# Patient Record
Sex: Female | Born: 1980 | Race: White | Hispanic: No | Marital: Single | Smoking: Never smoker
Health system: Southern US, Community
[De-identification: ages and names within clinical notes are randomized; demographics above are authoritative.]

## PROBLEM LIST (undated history)

## (undated) ENCOUNTER — Inpatient Hospital Stay (HOSPITAL_COMMUNITY): Payer: Self-pay

## (undated) DIAGNOSIS — R519 Headache, unspecified: Secondary | ICD-10-CM

## (undated) DIAGNOSIS — Z8619 Personal history of other infectious and parasitic diseases: Secondary | ICD-10-CM

## (undated) DIAGNOSIS — E039 Hypothyroidism, unspecified: Secondary | ICD-10-CM

## (undated) DIAGNOSIS — R51 Headache: Secondary | ICD-10-CM

## (undated) DIAGNOSIS — Z87448 Personal history of other diseases of urinary system: Secondary | ICD-10-CM

## (undated) HISTORY — DX: Headache, unspecified: R51.9

## (undated) HISTORY — DX: Personal history of other infectious and parasitic diseases: Z86.19

## (undated) HISTORY — DX: Personal history of other diseases of urinary system: Z87.448

## (undated) HISTORY — DX: Headache: R51

---

## 2012-01-04 HISTORY — PX: SHOULDER SURGERY: SHX246

## 2014-08-11 LAB — OB RESULTS CONSOLE HEPATITIS B SURFACE ANTIGEN: HEP B S AG: NEGATIVE

## 2014-08-11 LAB — OB RESULTS CONSOLE ABO/RH: RH Type: POSITIVE

## 2014-08-11 LAB — OB RESULTS CONSOLE RPR: RPR: NONREACTIVE

## 2014-08-11 LAB — OB RESULTS CONSOLE HIV ANTIBODY (ROUTINE TESTING): HIV: NONREACTIVE

## 2014-08-11 LAB — OB RESULTS CONSOLE GC/CHLAMYDIA
CHLAMYDIA, DNA PROBE: NEGATIVE
GC PROBE AMP, GENITAL: NEGATIVE

## 2014-08-11 LAB — OB RESULTS CONSOLE ANTIBODY SCREEN: ANTIBODY SCREEN: NEGATIVE

## 2014-08-11 LAB — OB RESULTS CONSOLE RUBELLA ANTIBODY, IGM: RUBELLA: IMMUNE

## 2015-01-04 NOTE — L&D Delivery Note (Signed)
Delivery Note At 6:30 PM a viable female was delivered via Vaginal, Spontaneous Delivery (Presentation: Left Occiput Anterior).  APGAR: , ; weight  .   Placenta status: Intact, Spontaneous.  Cord: 3 vessels with the following complications: None.  Cord pH: not sent  Anesthesia: Local  Episiotomy: None Lacerations: 2nd degree Suture Repair: 3.0 vicryl rapide Est. Blood Loss (mL): 100  Mom to postpartum.  Baby to Couplet care / Skin to Skin.  Meriel PicaHOLLAND,[name], 6:47 PM

## 2015-03-20 ENCOUNTER — Encounter (HOSPITAL_COMMUNITY): Payer: Self-pay | Admitting: *Deleted

## 2015-03-20 ENCOUNTER — Telehealth (HOSPITAL_COMMUNITY): Payer: Self-pay | Admitting: *Deleted

## 2015-03-20 LAB — OB RESULTS CONSOLE GBS: GBS: POSITIVE

## 2015-03-20 NOTE — Telephone Encounter (Signed)
Preadmission screen  

## 2015-03-21 ENCOUNTER — Inpatient Hospital Stay (HOSPITAL_COMMUNITY)
Admission: AD | Admit: 2015-03-21 | Discharge: 2015-03-23 | DRG: 775 | Disposition: A | Discharge status: Home / Self Care | Payer: BLUE CROSS/BLUE SHIELD | Source: Ambulatory Visit | Attending: Obstetrics and Gynecology | Admitting: Obstetrics and Gynecology

## 2015-03-21 ENCOUNTER — Encounter (HOSPITAL_COMMUNITY): Payer: Self-pay | Admitting: *Deleted

## 2015-03-21 DIAGNOSIS — Z3A40 40 weeks gestation of pregnancy: Secondary | ICD-10-CM | POA: Diagnosis not present

## 2015-03-21 LAB — CBC
HCT: 38.7 % (ref 36.0–46.0)
Hemoglobin: 13.5 g/dL (ref 12.0–15.0)
MCH: 29.1 pg (ref 26.0–34.0)
MCHC: 34.9 g/dL (ref 30.0–36.0)
MCV: 83.4 fL (ref 78.0–100.0)
PLATELETS: 255 10*3/uL (ref 150–400)
RBC: 4.64 MIL/uL (ref 3.87–5.11)
RDW: 13.5 % (ref 11.5–15.5)
WBC: 12 10*3/uL — ABNORMAL HIGH (ref 4.0–10.5)

## 2015-03-21 LAB — TYPE AND SCREEN
ABO/RH(D): A POS
Antibody Screen: NEGATIVE

## 2015-03-21 MED ORDER — IBUPROFEN 800 MG PO TABS
800.0000 mg | ORAL_TABLET | Freq: Three times a day (TID) | ORAL | Status: DC | PRN
  Filled 2015-03-21 (×2): qty 1

## 2015-03-21 MED ORDER — OXYTOCIN 10 UNIT/ML IJ SOLN
2.5000 [IU]/h | INTRAVENOUS | Status: DC
  Filled 2015-03-21: qty 10

## 2015-03-21 MED ORDER — ONDANSETRON HCL 4 MG/2ML IJ SOLN: 4.0000 mg | Freq: Four times a day (QID) | INTRAMUSCULAR | Status: DC | PRN

## 2015-03-21 MED ORDER — LACTATED RINGERS IV SOLN
INTRAVENOUS | Status: DC
  Administered 2015-03-21: 17:00:00 via INTRAVENOUS

## 2015-03-21 MED ORDER — PHENYLEPHRINE 40 MCG/ML (10ML) SYRINGE FOR IV PUSH (FOR BLOOD PRESSURE SUPPORT)
80.0000 ug | PREFILLED_SYRINGE | INTRAVENOUS | Status: DC | PRN
  Filled 2015-03-21: qty 2

## 2015-03-21 MED ORDER — DIPHENHYDRAMINE HCL 50 MG/ML IJ SOLN: 12.5000 mg | INTRAMUSCULAR | Status: DC | PRN

## 2015-03-21 MED ORDER — BISACODYL 10 MG RE SUPP: 10.0000 mg | Freq: Every day | RECTAL | Status: DC | PRN

## 2015-03-21 MED ORDER — OXYTOCIN BOLUS FROM INFUSION
500.0000 mL | INTRAVENOUS | Status: DC
Start: 2015-03-21 — End: 2015-03-21

## 2015-03-21 MED ORDER — FLEET ENEMA 7-19 GM/118ML RE ENEM: 1.0000 | ENEMA | Freq: Every day | RECTAL | Status: DC | PRN

## 2015-03-21 MED ORDER — CITRIC ACID-SODIUM CITRATE 334-500 MG/5ML PO SOLN: 30.0000 mL | ORAL | Status: DC | PRN

## 2015-03-21 MED ORDER — PRENATAL MULTIVITAMIN CH
1.0000 | ORAL_TABLET | Freq: Every day | ORAL | Status: DC
  Administered 2015-03-22: 1 via ORAL
  Filled 2015-03-21 (×2): qty 1

## 2015-03-21 MED ORDER — ONDANSETRON HCL 4 MG/2ML IJ SOLN: 4.0000 mg | INTRAMUSCULAR | Status: DC | PRN

## 2015-03-21 MED ORDER — SENNOSIDES-DOCUSATE SODIUM 8.6-50 MG PO TABS
2.0000 | ORAL_TABLET | ORAL | Status: DC
  Administered 2015-03-21: 2 via ORAL
  Filled 2015-03-21 (×2): qty 2

## 2015-03-21 MED ORDER — OXYTOCIN 10 UNIT/ML IJ SOLN
10.0000 [IU] | Freq: Once | INTRAMUSCULAR | Status: AC
  Administered 2015-03-21: 10 [IU] via INTRAMUSCULAR

## 2015-03-21 MED ORDER — MEASLES, MUMPS & RUBELLA VAC ~~LOC~~ INJ
0.5000 mL | INJECTION | Freq: Once | SUBCUTANEOUS | Status: DC
  Filled 2015-03-21: qty 0.5

## 2015-03-21 MED ORDER — LIDOCAINE HCL (PF) 1 % IJ SOLN
30.0000 mL | INTRAMUSCULAR | Status: AC | PRN
  Administered 2015-03-21: 30 mL via SUBCUTANEOUS
  Filled 2015-03-21: qty 30

## 2015-03-21 MED ORDER — ONDANSETRON HCL 4 MG PO TABS: 4.0000 mg | ORAL_TABLET | ORAL | Status: DC | PRN

## 2015-03-21 MED ORDER — DIBUCAINE 1 % RE OINT
1.0000 "application " | TOPICAL_OINTMENT | RECTAL | Status: DC | PRN
  Administered 2015-03-21: 1 via RECTAL
  Filled 2015-03-21: qty 28

## 2015-03-21 MED ORDER — LANOLIN HYDROUS EX OINT: TOPICAL_OINTMENT | CUTANEOUS | Status: DC | PRN

## 2015-03-21 MED ORDER — ACETAMINOPHEN 325 MG PO TABS: 650.0000 mg | ORAL_TABLET | ORAL | Status: DC | PRN

## 2015-03-21 MED ORDER — OXYCODONE-ACETAMINOPHEN 5-325 MG PO TABS: 1.0000 | ORAL_TABLET | ORAL | Status: DC | PRN

## 2015-03-21 MED ORDER — SIMETHICONE 80 MG PO CHEW: 80.0000 mg | CHEWABLE_TABLET | ORAL | Status: DC | PRN

## 2015-03-21 MED ORDER — OXYCODONE-ACETAMINOPHEN 5-325 MG PO TABS: 2.0000 | ORAL_TABLET | ORAL | Status: DC | PRN

## 2015-03-21 MED ORDER — OXYTOCIN 10 UNIT/ML IJ SOLN
INTRAMUSCULAR | Status: AC
  Filled 2015-03-21: qty 1

## 2015-03-21 MED ORDER — LACTATED RINGERS IV SOLN: 500.0000 mL | Freq: Once | INTRAVENOUS | Status: DC

## 2015-03-21 MED ORDER — FENTANYL 2.5 MCG/ML BUPIVACAINE 1/10 % EPIDURAL INFUSION (WH - ANES): 14.0000 mL/h | INTRAMUSCULAR | Status: DC | PRN

## 2015-03-21 MED ORDER — ZOLPIDEM TARTRATE 5 MG PO TABS: 5.0000 mg | ORAL_TABLET | Freq: Every evening | ORAL | Status: DC | PRN

## 2015-03-21 MED ORDER — LACTATED RINGERS IV SOLN
500.0000 mL | INTRAVENOUS | Status: DC | PRN
  Administered 2015-03-21: 1000 mL via INTRAVENOUS

## 2015-03-21 MED ORDER — WITCH HAZEL-GLYCERIN EX PADS
1.0000 "application " | MEDICATED_PAD | CUTANEOUS | Status: DC | PRN
  Administered 2015-03-21: 1 via TOPICAL

## 2015-03-21 MED ORDER — TETANUS-DIPHTH-ACELL PERTUSSIS 5-2.5-18.5 LF-MCG/0.5 IM SUSP: 0.5000 mL | Freq: Once | INTRAMUSCULAR | Status: DC

## 2015-03-21 MED ORDER — EPHEDRINE 5 MG/ML INJ
10.0000 mg | INTRAVENOUS | Status: DC | PRN
  Filled 2015-03-21: qty 2

## 2015-03-21 MED ORDER — DIPHENHYDRAMINE HCL 25 MG PO CAPS: 25.0000 mg | ORAL_CAPSULE | Freq: Four times a day (QID) | ORAL | Status: DC | PRN

## 2015-03-21 MED ORDER — SODIUM CHLORIDE 0.9 % IV SOLN
2.0000 g | Freq: Once | INTRAVENOUS | Status: AC
  Administered 2015-03-21: 2 g via INTRAVENOUS
  Filled 2015-03-21: qty 2000

## 2015-03-21 MED ORDER — BENZOCAINE-MENTHOL 20-0.5 % EX AERO
1.0000 "application " | INHALATION_SPRAY | CUTANEOUS | Status: DC | PRN
  Administered 2015-03-21: 1 via TOPICAL
  Filled 2015-03-21: qty 56

## 2015-03-21 MED ORDER — FLEET ENEMA 7-19 GM/118ML RE ENEM: 1.0000 | ENEMA | RECTAL | Status: DC | PRN

## 2015-03-21 NOTE — H&P (Signed)
[name] is a 35 y.o. female presenting for SROM + labor. Maternal Medical History:  Reason for admission: Rupture of membranes and contractions.   Contractions: Onset was 3-5 hours ago.   Frequency: regular.   Perceived severity is moderate.    Fetal activity: Perceived fetal activity is normal.      OB History    Gravida Para Term Preterm AB TAB SAB Ectopic Multiple Living   2    1 1  0        Past Medical History  Diagnosis Date  . Hx of varicella   . Headache   . Hx of pyelonephritis     x1 in high school   Past Surgical History  Procedure Laterality Date  . Shoulder surgery  2014    R shoulder   Family History: family history includes Asthma in her paternal grandmother; Cancer in her maternal aunt, paternal aunt, and paternal grandmother; Diabetes in her maternal grandmother and paternal grandmother; Heart attack in her brother and mother; Heart disease in her maternal grandmother; Thyroid disease in her maternal aunt, mother, paternal aunt, and paternal uncle. Social History:  reports that she has never smoked. She has never used smokeless tobacco. She reports that she does not drink alcohol or use illicit drugs.   Prenatal Transfer Tool  Maternal Diabetes: No Genetic Screening: Normal Maternal Ultrasounds/Referrals: Normal Fetal Ultrasounds or other Referrals:  None Maternal Substance Abuse:  No Significant Maternal Medications:  None Significant Maternal Lab Results:  None Other Comments:  None  ROS  Dilation: 10 Effacement (%): 100 Station: +1 Exam by:: Arelia SneddonS. FAulk, RN Blood pressure 147/93, pulse 90, temperature 98.5 F (36.9 C), temperature source Oral, resp. rate 18, last menstrual period 06/13/2014. Exam Physical Exam  Constitutional: She is oriented to person, place, and time. She appears well-developed and well-nourished.  HENT:  Head: Normocephalic and atraumatic.  Neck: Normal range of motion. Neck supple.  Cardiovascular: Normal rate and  regular rhythm.   Respiratory: Effort normal and breath sounds normal.  GI:  Term FH/ FHR 148  Genitourinary:  Adm 3>>>now C/C/+1  Musculoskeletal: Normal range of motion.  Neurological: She is alert and oriented to person, place, and time.    Prenatal labs: ABO, Rh: A/Positive/-- (08/08 0000) Antibody: Negative (08/08 0000) Rubella: Immune (08/08 0000) RPR: Nonreactive (08/08 0000)  HBsAg: Negative (08/08 0000)  HIV: Non-reactive (08/08 0000)  GBS: Positive (03/17 0000)   Assessment/Plan: Term IUP, labor, + GBS>>>for IV PCN   Webb Weed M 03/21/2015, 5:41 PM

## 2015-03-21 NOTE — MAU Note (Signed)
Pt reports water broke about 30 min ago and clear fluid out. reprots ctx on and off since yesterday. Good fetal movement felt.

## 2015-03-22 ENCOUNTER — Encounter (HOSPITAL_COMMUNITY)
Admission: AD | Disposition: A | Discharge status: Home / Self Care | Payer: Self-pay | Source: Ambulatory Visit | Attending: Obstetrics and Gynecology

## 2015-03-22 LAB — ABO/RH: ABO/RH(D): A POS

## 2015-03-22 LAB — CBC
HEMATOCRIT: 34.6 % — AB (ref 36.0–46.0)
Hemoglobin: 12 g/dL (ref 12.0–15.0)
MCH: 28.9 pg (ref 26.0–34.0)
MCHC: 34.7 g/dL (ref 30.0–36.0)
MCV: 83.4 fL (ref 78.0–100.0)
Platelets: 247 10*3/uL (ref 150–400)
RBC: 4.15 MIL/uL (ref 3.87–5.11)
RDW: 13.8 % (ref 11.5–15.5)
WBC: 14.2 10*3/uL — ABNORMAL HIGH (ref 4.0–10.5)

## 2015-03-22 LAB — RPR: RPR: NONREACTIVE

## 2015-03-22 SURGERY — LIGATION, FALLOPIAN TUBE, POSTPARTUM
Anesthesia: Choice | Laterality: Bilateral

## 2015-03-22 MED ORDER — LACTATED RINGERS IV SOLN: INTRAVENOUS | Status: DC

## 2015-03-22 MED ORDER — FAMOTIDINE 20 MG PO TABS
40.0000 mg | ORAL_TABLET | Freq: Once | ORAL | Status: AC
  Administered 2015-03-22: 40 mg via ORAL
  Filled 2015-03-22: qty 2

## 2015-03-22 MED ORDER — METOCLOPRAMIDE HCL 10 MG PO TABS
10.0000 mg | ORAL_TABLET | Freq: Once | ORAL | Status: AC
  Administered 2015-03-22: 10 mg via ORAL
  Filled 2015-03-22: qty 1

## 2015-03-22 NOTE — Progress Notes (Signed)
Patient ID: [name], female   DOB: Sep 14, 1980, 35 y.o.   MRN: [id number]   Pt is NPO , req PPTL, pt + husband in agreement they do NOT want further pregnancies>>proced + risks reviewed, along with failure rate 2-3 /1000

## 2015-03-22 NOTE — Progress Notes (Signed)
PPTL cancelled at pt request

## 2015-03-22 NOTE — Lactation Note (Signed)
This note was copied from a baby's chart. Lactation Consultation Note Initial visit at 23 hours of age.  Mom reports a few good feedings and then has been sleepy. [name]  Encouraged to feed with early cues on demand. [name]   Early newborn behavior discussed.  Hand expression reported by mom with colostrum visible.  Mom to call for assist as needed.    Patient Name: Stephanie Rella Larveicole Guerrero JYNWG'NToday's Date: 03/22/2015 Reason for consult: Initial assessment   Maternal Data Has patient been taught Hand Expression?: Yes Does the patient have breastfeeding experience prior to this delivery?: No  Feeding Feeding Type: Breast Fed (instructed to call for Latch score at next feeding) Length of feed: 15 min  LATCH Score/Interventions                Intervention(s): Breastfeeding basics reviewed     Lactation Tools Discussed/Used     Consult Status Consult Status: Follow-up Date: 03/23/15 Follow-up type: In-patient    [name], [name] 03/22/2015, 5:42 PM

## 2015-03-22 NOTE — Progress Notes (Signed)
Spouse requested vital signs be done after wife awakens.

## 2015-03-22 NOTE — Progress Notes (Signed)
Patient declines Flu vaccine at this time.

## 2015-03-23 ENCOUNTER — Inpatient Hospital Stay (HOSPITAL_COMMUNITY): Admission: RE | Admit: 2015-03-23 | Payer: BLUE CROSS/BLUE SHIELD | Source: Ambulatory Visit

## 2015-03-23 LAB — COMPREHENSIVE METABOLIC PANEL
ALK PHOS: 109 U/L (ref 38–126)
ALT: 14 U/L (ref 14–54)
AST: 17 U/L (ref 15–41)
Albumin: 2.8 g/dL — ABNORMAL LOW (ref 3.5–5.0)
Anion gap: 6 (ref 5–15)
BILIRUBIN TOTAL: 0.2 mg/dL — AB (ref 0.3–1.2)
BUN: 10 mg/dL (ref 6–20)
CALCIUM: 8.4 mg/dL — AB (ref 8.9–10.3)
CO2: 27 mmol/L (ref 22–32)
CREATININE: 0.61 mg/dL (ref 0.44–1.00)
Chloride: 106 mmol/L (ref 101–111)
Glucose, Bld: 83 mg/dL (ref 65–99)
Potassium: 4.1 mmol/L (ref 3.5–5.1)
Sodium: 139 mmol/L (ref 135–145)
TOTAL PROTEIN: 5.9 g/dL — AB (ref 6.5–8.1)

## 2015-03-23 LAB — CBC
HEMATOCRIT: 35.2 % — AB (ref 36.0–46.0)
Hemoglobin: 12 g/dL (ref 12.0–15.0)
MCH: 28.8 pg (ref 26.0–34.0)
MCHC: 34.1 g/dL (ref 30.0–36.0)
MCV: 84.6 fL (ref 78.0–100.0)
Platelets: 238 10*3/uL (ref 150–400)
RBC: 4.16 MIL/uL (ref 3.87–5.11)
RDW: 13.8 % (ref 11.5–15.5)
WBC: 9 10*3/uL (ref 4.0–10.5)

## 2015-03-23 NOTE — Lactation Note (Signed)
This note was copied from a baby's chart. Lactation Consultation Note  Patient Name: Boy Rella Larveicole [place] OZHYQ'MToday's Date: 03/23/2015 Reason for consult: Follow-up assessment   Follow up with mom of 40 hour old infant. Infant with 8 BF for 10-25 minutes. 2 attempts, 1 void and 6 stools in last 24 hours. Infant weight 6 lb 3.5 oz with 6% weight loss since birth. LATCH Scores 8 by bedside RN.  Mom reports that infant sleepy since circumcision this am, enc her to place him STS and attempt to feed every few hours or as he cues. Infant was asleep in crib. Enc mom to call for feeding assistance as needed prior to d/c.  Reviewed all BF information in Taking Care of Baby and Me Booklet. Reviewed I/O and maintaining feeding log and taking to Ped visit. [facility]  Reviewed engorgement prevention/treatment/comfort pumping with mom. Mom has a Personal DEBP at bedside.   Reviewed LC Brochure, mom aware of OP Services, LC Phone # and BF Support Groups. Enc parents to call with questions/concerns prn.     Maternal Data Has patient been taught Hand Expression?: Yes Does the patient have breastfeeding experience prior to this delivery?: No  Feeding Feeding Type: Breast Fed Length of feed: 20 min  LATCH Score/Interventions Latch: Grasps breast easily, tongue down, lips flanged, rhythmical sucking.  Audible Swallowing: Spontaneous and intermittent  Type of Nipple: Everted at rest and after stimulation  Comfort (Breast/Nipple): Filling, red/small blisters or bruises, mild/mod discomfort  Problem noted: Mild/Moderate discomfort Interventions (Mild/moderate discomfort): Hand expression  Hold (Positioning): No assistance needed to correctly position infant at breast.  LATCH Score: 9  Lactation Tools Discussed/Used     Consult Status Consult Status: Complete Follow-up type: Call as needed    Ed BlalockSharon S Leyton Brownlee 03/23/2015, 11:19 AM

## 2015-03-23 NOTE — Progress Notes (Signed)
Post Partum Day 2 Subjective:  no complaints, denies HA, blurred vision or RUQ pain   Objective: Blood pressure 166/93, pulse 76, temperature 98 F (36.7 C), temperature source Oral, resp. rate 18, height 5\' 2"  (1.575 m), weight 210 lb (95.255 kg), last menstrual period 06/13/2014, SpO2 98 %, unknown if currently breastfeeding.  Physical Exam:  General: no complaints Lochia: appropriate Uterine Fundus: firm Incision: healing well DVT Evaluation: No evidence of DVT seen on physical exam. Negative Homan's sign. No cords or calf tenderness. No significant calf/ankle edema. DTR's 1+ no clonus   Recent Labs  03/21/15 1657 03/22/15 0530  HGB 13.5 12.0  HCT 38.7 34.6*    Assessment/Plan: Discharge home bp rechecked, 132/86, will check cmp and cbc prior to discharge  LOS: 2 days   Chung Chagoya G 03/23/2015, 8:18 AM

## 2015-03-23 NOTE — Progress Notes (Signed)
Dr Marcelle OverlieHolland called and given report on patients elevated B/P. 0 orders received at this time. Per Dr Marcelle OverlieHolland, patient will be assessed during rounds.

## 2015-03-23 NOTE — Discharge Summary (Signed)
Obstetric Discharge Summary Reason for Admission: rupture of membranes Prenatal Procedures: ultrasound Intrapartum Procedures: spontaneous vaginal delivery Postpartum Procedures: none Complications-Operative and Postpartum: 2 degree perineal laceration HEMOGLOBIN  Date Value Ref Range Status  03/22/2015 12.0 12.0 - 15.0 g/dL Final   HCT  Date Value Ref Range Status  03/22/2015 34.6* 36.0 - 46.0 % Final    Physical Exam:  General: alert and cooperative Lochia: appropriate Uterine Fundus: firm Incision: healing well DVT Evaluation: No evidence of DVT seen on physical exam. Negative Homan's sign. No cords or calf tenderness. No significant calf/ankle edema.  Discharge Diagnoses: Term Pregnancy-delivered  Discharge Information: Date: 03/23/2015 Activity: pelvic rest Diet: routine Medications: PNV and Ibuprofen Condition: stable Instructions: refer to practice specific booklet Discharge to: home   Newborn Data: Live born female  Birth Weight: 6 lb 9.3 oz (2985 g) APGAR: 6, 8  Home with mother and desires circ prior to discharge.  [name] G 03/23/2015, 8:23 AM

## 2015-03-24 ENCOUNTER — Telehealth (HOSPITAL_COMMUNITY): Payer: Self-pay | Admitting: Lactation Services

## 2015-03-24 NOTE — Telephone Encounter (Signed)
Joni Reiningicole called concerned how her "left breast was swollen up and big and warm".  She is putting ice on it, but when she pumps, milk is only coming out in one "pore".  Discussed that when milk volume increases, breasts will often become heavy, warm, and larger.  Suggested she feed baby first on left breast, and pump for comfort after breastfeeding.  If breasts are very full and hard, ice packs for 20 minutes with pumping after can be helpful to relieve the fullness.  Mom states baby is latching and feeding well.  To call us back as needed for help.

## 2015-10-09 ENCOUNTER — Ambulatory Visit
Admission: EM | Admit: 2015-10-09 | Discharge: 2015-10-09 | Disposition: A | Discharge status: Home / Self Care | Payer: BLUE CROSS/BLUE SHIELD | Attending: Family Medicine | Admitting: Family Medicine

## 2015-10-09 ENCOUNTER — Encounter: Payer: Self-pay | Admitting: *Deleted

## 2015-10-09 DIAGNOSIS — L089 Local infection of the skin and subcutaneous tissue, unspecified: Secondary | ICD-10-CM

## 2015-10-09 DIAGNOSIS — L723 Sebaceous cyst: Secondary | ICD-10-CM | POA: Diagnosis not present

## 2015-10-09 MED ORDER — LIDOCAINE HCL (PF) 1 % IJ SOLN: 5.0000 mL | Freq: Once | INTRAMUSCULAR | Status: DC

## 2015-10-09 MED ORDER — SULFAMETHOXAZOLE-TRIMETHOPRIM 800-160 MG PO TABS: 1.0000 | ORAL_TABLET | Freq: Two times a day (BID) | ORAL | 0 refills | Status: AC

## 2015-10-09 NOTE — ED Provider Notes (Signed)
[facility] ____________________________________________  Time seen: Approximately 9:49 AM  I have reviewed the triage vital signs and the nursing notes.   HISTORY  Chief Complaint Abscess   HPI [name] is a 35 y.o. female presents for evaluation of the tender, red and swollen cyst to her right back over the last 5 days. Patient reports that she has had a cyst that area for many years but reports the last 5 days the area has become red and swollen. States mild tenderness to that same area. Denies any drainage. Patient reports that she feels well otherwise.  Denies fevers, nausea, vomiting, diarrhea, midline back pain, pain radiation or other complaints. Patient reports that she does occasionally still breast-feeding her 573-month-old. Patient states that she does not get much breast milk out at this time and states if needed she has no problems to pump and dump at this time. Reports last menstrual period was 2 weeks ago, denies chance of pregnancy.   TOMBLIN Cristie HemII,JAMES E, MD PCP   Past Medical History:  Diagnosis Date  . Headache   . Hx of pyelonephritis    x1 in high school  . Hx of varicella     There are no active problems to display for this patient.   Past Surgical History:  Procedure Laterality Date  . SHOULDER SURGERY  2014   R shoulder    Current Outpatient Rx  . Order #: [id number] Class: Historical Med  . Order #: [id number] Class: Normal     Current Facility-Administered Medications:  .  lidocaine (PF) (XYLOCAINE) 1 % injection 5 mL, 5 mL, Other, Once, Renford DillsLindsey Ceili Boshers, NP  Current Outpatient Prescriptions:  .  Prenatal Vit-Fe Fumarate-FA (MULTIVITAMIN-PRENATAL) 27-0.8 MG TABS tablet, Take 1 tablet by mouth daily at 12 noon., Disp: , Rfl:  .  sulfamethoxazole-trimethoprim (BACTRIM DS,SEPTRA DS) 800-160 MG tablet, Take 1 tablet by mouth 2 (two) times daily., Disp: 14 tablet, Rfl: 0  Allergies Review of patient's allergies indicates no known  allergies.  Family History  Problem Relation Age of Onset  . Diabetes Paternal Grandmother   . Cancer Paternal Grandmother   . Asthma Paternal Grandmother   . Heart attack Mother   . Thyroid disease Mother   . Heart attack Brother   . Thyroid disease Maternal Aunt   . Cancer Maternal Aunt     breast  . Thyroid disease Paternal Aunt   . Cancer Paternal Aunt   . Thyroid disease Paternal Uncle   . Heart disease Maternal Grandmother   . Diabetes Maternal Grandmother     Social History Social History  Substance Use Topics  . Smoking status: Never Smoker  . Smokeless tobacco: Never Used  . Alcohol use No    Review of Systems Constitutional: No fever/chills Eyes: No visual changes. ENT: No sore throat. Cardiovascular: Denies chest pain. Respiratory: Denies shortness of breath. Gastrointestinal: No abdominal pain.  No nausea, no vomiting.  No diarrhea.  No constipation. Genitourinary: Negative for dysuria. Musculoskeletal: Negative for back pain. Skin: Negative for rash. As above. Neurological: Negative for headaches, focal weakness or numbness.  10-point ROS otherwise negative.  ____________________________________________   PHYSICAL EXAM:  VITAL SIGNS: ED Triage Vitals  Enc Vitals Group     BP 10/09/15 0936 116/79     Pulse Rate 10/09/15 0936 65     Resp 10/09/15 0936 16     Temp 10/09/15 0936 99.1 F (37.3 C)     Temp Source 10/09/15 0936 Oral     SpO2 10/09/15  0936 100 %     Weight 10/09/15 0937 188 lb (85.3 kg)     Height 10/09/15 0937 5\' 2"  (1.575 m)     Head Circumference --      Peak Flow --      Pain Score 10/09/15 0940 3     Pain Loc --      Pain Edu? --      Excl. in GC? --     Constitutional: Alert and oriented. Well appearing and in no acute distress. Eyes: Conjunctivae are normal. PERRL. EOMI. ENT      Head: Normocephalic and atraumatic.      Mouth/Throat: Mucous membranes are moist.Oropharynx non-erythematous. Cardiovascular: Normal rate,  regular rhythm. Grossly normal heart sounds.  Good peripheral circulation. Respiratory: Normal respiratory effort without tachypnea nor retractions. Breath sounds are clear and equal bilaterally. No wheezes/rales/rhonchi.. Gastrointestinal: Soft and nontender. No distention. No CVA tenderness. Musculoskeletal:  Ambulatory with steady gait.  Neurologic:  Normal speech and language. No gross focal neurologic deficits are appreciated. Speech is normal. No gait instability.  Skin:  Skin is warm, dry and intact. No rash noted. Except: Right lower lumbar back area of approximately 3 x 3 cm of moderate induration with fluctuance and mild to moderate immediate surrounding erythema, no other surrounding erythema, mild tenderness to direct palpation, slight pointing.  Psychiatric: Mood and affect are normal. Speech and behavior are normal. Patient exhibits appropriate insight and judgment   ___________________________________________   LABS (all labs ordered are listed, but only abnormal results are displayed)  Labs Reviewed - No data to display ____________________________________________  RADIOLOGY  No results found. ____________________________________________   PROCEDURES Procedures    Procedure(s) performed:  Procedure(s) performed:  Procedure explained and verbal consent obtained. Consent: Verbal consent obtained. Written consent not obtained. Risks and benefits: risks, benefits and alternatives were discussed Patient identity confirmed: verbally with patient and hospital-assigned identification number  Consent given by: patient   I&D abscess Location: Right lower back Preparation: Patient was prepped and draped in the usual sterile fashion. Anesthesia with 1% Lidocaine 5 mls Irrigation solution: saline and betadine Amount of cleaning: copious Incision made with #11 blade scalpel Moderate purulent drainage immediately obtained with expression. Sterile forceps used to probe and  break up loculations.  Irrigated with normal saline. 1/4 " iodoform gauze used and packed.  Patient tolerate well. Wound well approximated post repair.  dressing applied.  Wound care instructions provided.  Observe for any signs of infection or other problems.    __________________________________________   INITIAL IMPRESSION / ASSESSMENT AND PLAN / ED COURSE  Pertinent labs & imaging results that were available during my care of the patient were reviewed by me and considered in my medical decision making (see chart for details).  Well-appearing patient. No acute distress. Presents for acutely infected sebaceous abscess to right lower back. Abscess incised and drained. Packing applied. Patient tolerated well. Patient does still supplement only breast-feed, denies chance of pregnancy.   will place patient on oral Bactrim for infection. Discussed pumping and discarding until after completed antibiotic regimen. Encouraged patient to follow-up with outpatient surgeon in 3 days for abscess follow-up, discuss if unable to see by surgeon or primary care return to urgent care for follow-up and packing removal. Encouraged keeping clean and dry.Discussed indication, risks and benefits of medications with patient.  Discussed follow up with Primary care physician this week. Discussed follow up and return parameters including no resolution or any worsening concerns. Patient verbalized understanding and agreed to  plan.   ____________________________________________   FINAL CLINICAL IMPRESSION(S) / ED DIAGNOSES  Final diagnoses:  Infected sebaceous cyst     Discharge Medication List as of 10/09/2015 10:25 AM    START taking these medications   Details  sulfamethoxazole-trimethoprim (BACTRIM DS,SEPTRA DS) 800-160 MG tablet Take 1 tablet by mouth 2 (two) times daily., Starting Fri 10/09/2015, Until Fri 10/16/2015, Normal        Note: This dictation was prepared with Dragon dictation along with  smaller phrase technology. Any transcriptional errors that result from this process are unintentional.    Clinical Course      Renford Dills, NP 10/09/15 1059

## 2015-10-09 NOTE — Discharge Instructions (Signed)
Take medication as prescribed. Rest. Drink plenty of fluids. Keep clean and dry. Pump and discard as discussed.   Follow up with PCP or surgery in 3 days as discussed. Call today.  Follow up with your primary care physician this week as needed. Return to Urgent care for new or worsening concerns.

## 2015-10-09 NOTE — ED Triage Notes (Signed)
Abcess to lower right back.

## 2015-10-11 ENCOUNTER — Telehealth: Payer: Self-pay | Admitting: Emergency Medicine

## 2015-10-11 NOTE — Telephone Encounter (Signed)
Tried calling patient- no answer. Message left for patient to call us back if her symptoms are not improving or worsening.

## 2015-10-12 ENCOUNTER — Other Ambulatory Visit: Payer: Self-pay

## 2015-10-13 ENCOUNTER — Encounter: Payer: Self-pay | Admitting: Surgery

## 2015-10-13 ENCOUNTER — Ambulatory Visit (INDEPENDENT_AMBULATORY_CARE_PROVIDER_SITE_OTHER): Payer: BLUE CROSS/BLUE SHIELD | Admitting: Surgery

## 2015-10-13 VITALS — BP 144/78 | HR 64 | Temp 98.5°F | Ht 62.0 in | Wt 192.0 lb

## 2015-10-13 DIAGNOSIS — L089 Local infection of the skin and subcutaneous tissue, unspecified: Secondary | ICD-10-CM

## 2015-10-13 DIAGNOSIS — L723 Sebaceous cyst: Secondary | ICD-10-CM | POA: Diagnosis not present

## 2015-10-13 HISTORY — DX: Local infection of the skin and subcutaneous tissue, unspecified: L08.9

## 2015-10-13 NOTE — Progress Notes (Signed)
10/13/2015  Reason for Visit:  Infected sebaceous cyst of the lower back  History of Present Illness: [name] is a 35 y.o. female who presents with an infected sebaceous cyst of the lower back. She had noticed late last week she was having more pain over a known area for a sebaceous cyst on the lower back. The area was tender with cellulitis. She presented to her PCP office and they performed an I&D at bedside. The wound was packed and she was referred to our office for further evaluation. She is currently on a one-week course of Bactrim. Before this cyst got infected the patient reports that she's had this for at least 10 years with no complications and that she periodically would squeeze the cyst to drain and then the cyst would fill back up. Prior to the I&D the patient denies having any fevers, chills, chest pain, shortness of breath, nausea, vomiting, abdominal pain, dysuria, hematuria, diarrhea, constipation.  Past Medical History: Past Medical History:  Diagnosis Date  . Headache   . Hx of pyelonephritis    x1 in high school  . Hx of varicella      Past Surgical History: Past Surgical History:  Procedure Laterality Date  . SHOULDER SURGERY  2014   R shoulder    Home Medications: Prior to Admission medications   Medication Sig Start Date End Date Taking? Authorizing Provider  HEATHER 0.35 MG tablet Take 1 tablet by mouth daily. 09/29/15  Yes Historical Provider, MD  Prenatal Vit-Fe Fumarate-FA (MULTIVITAMIN-PRENATAL) 27-0.8 MG TABS tablet Take 1 tablet by mouth daily at 12 noon.   Yes Historical Provider, MD  sulfamethoxazole-trimethoprim (BACTRIM DS,SEPTRA DS) 800-160 MG tablet Take 1 tablet by mouth 2 (two) times daily. 10/09/15 10/16/15 Yes Renford Dills, NP    Allergies: No Known Allergies  Social History:  reports that she has never smoked. She has never used smokeless tobacco. She reports that she does not drink alcohol or use drugs.   Family History: Family  History  Problem Relation Age of Onset  . Diabetes Paternal Grandmother   . Cancer Paternal Grandmother   . Asthma Paternal Grandmother   . Heart attack Mother   . Thyroid disease Mother   . Heart attack Brother   . Thyroid disease Maternal Aunt   . Cancer Maternal Aunt     breast  . Thyroid disease Paternal Aunt   . Cancer Paternal Aunt   . Thyroid disease Paternal Uncle   . Heart disease Maternal Grandmother   . Diabetes Maternal Grandmother     Review of Systems: Review of Systems  Constitutional: Negative for chills and fever.  Eyes: Negative for blurred vision.  Respiratory: Negative for cough and shortness of breath.   Cardiovascular: Negative for chest pain and claudication.  Gastrointestinal: Negative for abdominal pain, constipation, diarrhea, heartburn, nausea and vomiting.  Genitourinary: Negative for dysuria and hematuria.  Musculoskeletal: Negative for myalgias.  Skin: Negative for rash.  Neurological: Negative for dizziness and headaches.  Psychiatric/Behavioral: Negative for depression.  All other systems reviewed and are negative.   Physical Exam BP (!) 144/78   Pulse 64   Temp 98.5 F (36.9 C) (Oral)   Ht 5\' 2"  (1.575 m)   Wt 87.1 kg (192 lb)   LMP 09/28/2015   Breastfeeding? Yes   BMI 35.12 kg/m  CONSTITUTIONAL: No acute distress HEENT:  Normocephalic, atraumatic, extraocular motion intact. NECK: Trachea is midline, and there is no jugular venous distension.  LYMPH NODES:  Lymph nodes  in the neck are not enlarged. RESPIRATORY:  Lungs are clear, and breath sounds are equal bilaterally. Normal respiratory effort without pathologic use of accessory muscles. CARDIOVASCULAR: Heart is regular without murmurs, gallops, or rubs. GI: The abdomen is soft, nondistended, nontender. There were no palpable masses. There was no hepatosplenomegaly. MUSCULOSKELETAL:  Normal muscle strength and tone in all four extremities.  No peripheral edema or cyanosis. SKIN:  Patient has on the lower back a 1 cm transverse incision with gauze wick for packing. She has a palpable mass measuring approximately 1.5 cm beneath this incision. There is no purulent drainage and no cellulitis or significant tenderness. NEUROLOGIC:  Motor and sensation is grossly normal.  Cranial nerves are grossly intact. PSYCH:  Alert and oriented to person, place and time. Affect is normal.  Laboratory Analysis: No results found for this or any previous visit (from the past 24 hour(s)).  Imaging: No results found.  Assessment and Plan: This is a 35 y.o. female who presents with an infected sebaceous cyst of the lower back status post bedside I&D at the office by her PCP.  I have discussed with the patient regarding excising the cyst. However at this point given the cyst infection I would recommend that the patient continue her antibiotic course to allow for the infection and inflammation to come down and then proceed with cyst excision. This can be done as a bedside procedure the patient will be booked for 10/23. Her current packing has been removed and exchanged for new gauze and this can be removed on 10/15/15. After that her wound can be dressed with regular gauze only. The patient is aware of signs and symptoms to pay attention to that could reflect recurring infection of the cyst. If that were the case she knows to call the clinic right away. Patient understands this plan and is in agreement and all of her questions have been answered.   Howie IllJose Luis Jaciel Diem, MD [facility]

## 2015-10-13 NOTE — Patient Instructions (Addendum)
Continue your antibiotics until they are complete. After they are complete, this can last through your Breast Milk for up to 27 hours. Please wait at least 36 hours after your last dose prior to feeding baby.  Please follow-up for your sebaceous cyst removal as scheduled below in our [place] office.  Pull packing from wound on Thursday 10/15/15.  If you develop redness, worsening inflammation, or fever; please call our office immediately.

## 2015-10-26 ENCOUNTER — Ambulatory Visit (INDEPENDENT_AMBULATORY_CARE_PROVIDER_SITE_OTHER): Payer: BLUE CROSS/BLUE SHIELD | Admitting: Surgery

## 2015-10-26 ENCOUNTER — Encounter: Payer: Self-pay | Admitting: Surgery

## 2015-10-26 VITALS — BP 121/81 | HR 66 | Temp 98.7°F | Wt 191.0 lb

## 2015-10-26 DIAGNOSIS — L723 Sebaceous cyst: Secondary | ICD-10-CM | POA: Diagnosis not present

## 2015-10-26 DIAGNOSIS — L089 Local infection of the skin and subcutaneous tissue, unspecified: Secondary | ICD-10-CM

## 2015-10-26 NOTE — Progress Notes (Signed)
10/26/2015  History of Present Illness: [name] is a 35 y.o. female who presents after I&D and antibiotic treatment of an infected back sebaceous cyst.  She was seen in office on 10/10 for her initial visit and plan was made to finish the antibiotic course and follow up for excision of her cyst.  Today she denies any further drainage, redness, or tenderness.  No other complaints.  Past Medical History: Past Medical History:  Diagnosis Date  . Headache   . Hx of pyelonephritis    x1 in high school  . Hx of varicella      Past Surgical History: Past Surgical History:  Procedure Laterality Date  . SHOULDER SURGERY  2014   R shoulder    Home Medications: Prior to Admission medications   Medication Sig Start Date End Date Taking? Authorizing Provider  HEATHER 0.35 MG tablet Take 1 tablet by mouth daily. 09/29/15  Yes Historical Provider, MD    Allergies: No Known Allergies  Social History:  reports that she has never smoked. She has never used smokeless tobacco. She reports that she does not drink alcohol or use drugs.   Family History: Family History  Problem Relation Age of Onset  . Diabetes Paternal Grandmother   . Cancer Paternal Grandmother   . Asthma Paternal Grandmother   . Heart attack Mother   . Thyroid disease Mother   . Heart attack Brother   . Thyroid disease Maternal Aunt   . Cancer Maternal Aunt     breast  . Thyroid disease Paternal Aunt   . Cancer Paternal Aunt   . Thyroid disease Paternal Uncle   . Heart disease Maternal Grandmother   . Diabetes Maternal Grandmother     Review of Systems: Review of Systems  Constitutional: Negative for chills and fever.  Eyes: Negative for blurred vision.  Respiratory: Negative for cough and shortness of breath.   Cardiovascular: Negative for chest pain and leg swelling.  Gastrointestinal: Negative for abdominal pain, nausea and vomiting.  Genitourinary: Negative for dysuria and hematuria.   Musculoskeletal: Negative for myalgias.  Skin: Negative for rash.  Neurological: Negative for dizziness and headaches.  Psychiatric/Behavioral: Negative for depression.    Physical Exam BP 121/81   Pulse 66   Temp 98.7 F (37.1 C) (Oral)   Wt 86.6 kg (191 lb)   LMP 09/28/2015   BMI 34.93 kg/m  CONSTITUTIONAL: No acute distress. HEENT:  Normocephalic, atraumatic, extraocular motion intact. NECK: Trachea is midline, and there is no jugular venous distension.  LYMPH NODES:  Lymph nodes in the neck are not enlarged. RESPIRATORY:  Lungs are clear, and breath sounds are equal bilaterally. Normal respiratory effort without pathologic use of accessory muscles. CARDIOVASCULAR: Heart is regular without murmurs, gallops, or rubs. GI: The abdomen is soft, nondistended, nontender. MUSCULOSKELETAL:  Normal muscle strength and tone in all four extremities.  No peripheral edema or cyanosis. SKIN: Patient's previous I&D site on lower back has healed.  There is some mild firmness noted under the scar which is likely from healing process.  No erythema, no tenderness, no drainage.  NEUROLOGIC:  Motor and sensation is grossly normal.  Cranial nerves are grossly intact. PSYCH:  Alert and oriented to person, place and time. Affect is normal.   Assessment and Plan: This is a 35 y.o. female who presents with a previously infected lower back sebaceous cyst, s/p prior I&D and po antibiotic treatment.  She now returns for cyst excision.  Risks and benefits have been explained  to the patient and she is willing to proceed.  Consent has been signed.  Following the procedure, the patient will follow up in a week for wound check.  She will have steri strips and a gauze/tegaderm dressing.  Post-op instructions have been given and patient understands them.   Howie Ill, MD [facility]

## 2015-10-26 NOTE — Progress Notes (Signed)
  Procedure Date:  10/26/2015  Pre-operative Diagnosis:  Infected lower back sebaceous cyst  Post-operative Diagnosis: Same  Procedure:  1.  Excision of lower back sebaceous cyst. 2.  Layered closure of lower back wound  Surgeon:  Howie IllJose Luis Turkessa Ostrom, MD  Anesthesia:  10 ml of 1% lidocaine with epinephrine.  Estimated Blood Loss:  1 ml  Specimens:  Sebaceous cyst  Complications:  None  Indications for Procedure:  This is a 35 y.o. female who had a prior I&D and po antibiotic treatment for an infected lower back sebaceous cyst.  She now presents for cyst excision.  Risks and benefits were discussed with the patient and she was willing to proceed.  Description of Procedure: The patient was correctly identified at bedside.  Appropriate time-outs were performed.  The lower back was prepped and draped in usual sterile fashion.  10 ml of local anesthetic were infused around the patient's prior I&D site.  An 1.5 cm elliptical incision was made encompassing the previous I&D site.  Using sharp and blunt dissection, the sebaceous cyst was removed intact and in its entirety and sent to pathology.  The wound was then irrigated and closed in two layers using 3-0 Vicryl and 4-0 Monocryl sutures.  The wound was cleaned and dressed sterilely with steri strips, gauze, and tegaderm.  The patient tolerated the procedure well and all counts were correct at the end of the case.   Howie IllJose Luis Nancee Brownrigg, MD

## 2015-10-26 NOTE — Patient Instructions (Signed)
Please give us a call if you have any questions or concern.  Please do not remove your bandages for at least 24 hours. Then just remove it but leave the steri-strips on until they fall off on their own.

## 2015-11-03 ENCOUNTER — Encounter: Payer: Self-pay | Admitting: Surgery

## 2015-11-03 ENCOUNTER — Telehealth: Payer: Self-pay

## 2015-11-03 NOTE — Telephone Encounter (Signed)
Called patient and had to leave a voicemail message to call me back.  Patient's pathology stated that she has a benign epidermal inclusion cyst.

## 2015-11-03 NOTE — Telephone Encounter (Signed)
Patient called back and I informed her that her cyst is benign. Patient had no further questions.

## 2015-11-04 ENCOUNTER — Encounter: Payer: Self-pay | Admitting: Surgery

## 2015-11-04 ENCOUNTER — Ambulatory Visit (INDEPENDENT_AMBULATORY_CARE_PROVIDER_SITE_OTHER): Payer: BLUE CROSS/BLUE SHIELD | Admitting: Surgery

## 2015-11-04 VITALS — BP 133/84 | HR 70 | Temp 98.2°F | Ht 62.0 in | Wt 193.0 lb

## 2015-11-04 DIAGNOSIS — L089 Local infection of the skin and subcutaneous tissue, unspecified: Secondary | ICD-10-CM

## 2015-11-04 DIAGNOSIS — L723 Sebaceous cyst: Secondary | ICD-10-CM

## 2015-11-04 NOTE — Patient Instructions (Signed)

## 2015-11-04 NOTE — Progress Notes (Signed)
11/04/2015  HPI: Patient is s/p excision of sebaceous cyst of the lower back on 10/23.  No complications and the patient reports her wound has been healing well.  No worsening pain, fevers, or drainage.  Vital signs: BP 133/84 (BP Location: Left Arm, Patient Position: Sitting)   Pulse 70   Temp 98.2 F (36.8 C) (Oral)   Ht 5\' 2"  (1.575 m)   Wt 87.5 kg (193 lb)   LMP 09/28/2015   BMI 35.30 kg/m    Physical Exam: Constitutional:  No acute distress Skin:  Lower back incision has healed well.  The tail of the suture is visible through the right corner of the wound, but no evidence of infection, erythema, or drainage.  No tenderness.  Assessment/Plan: 35 yo female s/p excision of sebaceous cyst of the lower back.  -Have reassured patient that the suture will reabsorb and disappear on its own.  If the suture bothers her, we can cut it but she has chosen to wait. -Instructions regarding signs/symptoms to watch to return to clinic have been given and she understands them. -Follow up as needed.   Howie IllJose Luis Ha Placeres, MD [facility]

## 2016-03-04 ENCOUNTER — Other Ambulatory Visit: Payer: Self-pay | Admitting: Family Medicine

## 2016-03-04 DIAGNOSIS — Z1231 Encounter for screening mammogram for malignant neoplasm of breast: Secondary | ICD-10-CM

## 2016-04-05 ENCOUNTER — Ambulatory Visit
Admission: RE | Admit: 2016-04-05 | Discharge: 2016-04-05 | Disposition: A | Discharge status: Home / Self Care | Payer: BLUE CROSS/BLUE SHIELD | Source: Ambulatory Visit | Attending: Family Medicine | Admitting: Family Medicine

## 2016-04-05 DIAGNOSIS — Z1231 Encounter for screening mammogram for malignant neoplasm of breast: Secondary | ICD-10-CM | POA: Insufficient documentation

## 2016-08-08 ENCOUNTER — Other Ambulatory Visit (HOSPITAL_COMMUNITY): Payer: Self-pay | Admitting: Family Medicine

## 2016-08-08 DIAGNOSIS — Z809 Family history of malignant neoplasm, unspecified: Secondary | ICD-10-CM

## 2016-08-08 DIAGNOSIS — Z9189 Other specified personal risk factors, not elsewhere classified: Secondary | ICD-10-CM

## 2016-08-10 ENCOUNTER — Other Ambulatory Visit: Payer: Self-pay | Admitting: Family Medicine

## 2016-08-12 ENCOUNTER — Ambulatory Visit (HOSPITAL_COMMUNITY): Admission: RE | Admit: 2016-08-12 | Payer: BLUE CROSS/BLUE SHIELD | Source: Ambulatory Visit

## 2016-08-18 ENCOUNTER — Other Ambulatory Visit: Payer: Self-pay

## 2016-08-18 ENCOUNTER — Emergency Department
Admission: EM | Admit: 2016-08-18 | Discharge: 2016-08-19 | Disposition: A | Discharge status: Home / Self Care | Payer: BLUE CROSS/BLUE SHIELD | Attending: Emergency Medicine | Admitting: Emergency Medicine

## 2016-08-18 ENCOUNTER — Encounter: Payer: Self-pay | Admitting: Emergency Medicine

## 2016-08-18 ENCOUNTER — Emergency Department: Payer: BLUE CROSS/BLUE SHIELD

## 2016-08-18 DIAGNOSIS — G43109 Migraine with aura, not intractable, without status migrainosus: Secondary | ICD-10-CM

## 2016-08-18 DIAGNOSIS — Z79899 Other long term (current) drug therapy: Secondary | ICD-10-CM | POA: Diagnosis not present

## 2016-08-18 DIAGNOSIS — H539 Unspecified visual disturbance: Secondary | ICD-10-CM | POA: Diagnosis not present

## 2016-08-18 DIAGNOSIS — R200 Anesthesia of skin: Secondary | ICD-10-CM

## 2016-08-18 DIAGNOSIS — R202 Paresthesia of skin: Secondary | ICD-10-CM | POA: Diagnosis present

## 2016-08-18 LAB — APTT: APTT: 29 s (ref 24–36)

## 2016-08-18 LAB — CBC
HCT: 42.6 % (ref 35.0–47.0)
HEMOGLOBIN: 14.9 g/dL (ref 12.0–16.0)
MCH: 29.5 pg (ref 26.0–34.0)
MCHC: 34.9 g/dL (ref 32.0–36.0)
MCV: 84.5 fL (ref 80.0–100.0)
PLATELETS: 270 10*3/uL (ref 150–440)
RBC: 5.03 MIL/uL (ref 3.80–5.20)
RDW: 13 % (ref 11.5–14.5)
WBC: 9.2 10*3/uL (ref 3.6–11.0)

## 2016-08-18 LAB — DIFFERENTIAL
BASOS ABS: 0.1 10*3/uL (ref 0–0.1)
Basophils Relative: 1 %
EOS ABS: 0.4 10*3/uL (ref 0–0.7)
EOS PCT: 4 %
LYMPHS ABS: 3.7 10*3/uL — AB (ref 1.0–3.6)
LYMPHS PCT: 41 %
Monocytes Absolute: 1 10*3/uL — ABNORMAL HIGH (ref 0.2–0.9)
Monocytes Relative: 11 %
Neutro Abs: 4 10*3/uL (ref 1.4–6.5)
Neutrophils Relative %: 43 %

## 2016-08-18 LAB — COMPREHENSIVE METABOLIC PANEL WITH GFR
ALT: 18 U/L (ref 14–54)
AST: 18 U/L (ref 15–41)
Albumin: 4.1 g/dL (ref 3.5–5.0)
Alkaline Phosphatase: 43 U/L (ref 38–126)
Anion gap: 7 (ref 5–15)
BUN: 9 mg/dL (ref 6–20)
CO2: 24 mmol/L (ref 22–32)
Calcium: 9 mg/dL (ref 8.9–10.3)
Chloride: 106 mmol/L (ref 101–111)
Creatinine, Ser: 0.73 mg/dL (ref 0.44–1.00)
GFR calc Af Amer: >60 mL/min
GFR calc non Af Amer: >60 mL/min
Glucose, Bld: 116 mg/dL — ABNORMAL HIGH (ref 65–99)
Potassium: 3.2 mmol/L — ABNORMAL LOW (ref 3.5–5.1)
Sodium: 137 mmol/L (ref 135–145)
Total Bilirubin: 0.4 mg/dL (ref 0.3–1.2)
Total Protein: 7.7 g/dL (ref 6.5–8.1)

## 2016-08-18 LAB — GLUCOSE, CAPILLARY: Glucose-Capillary: 102 mg/dL — ABNORMAL HIGH (ref 65–99)

## 2016-08-18 LAB — PROTIME-INR
INR: 0.91
PROTHROMBIN TIME: 12.2 s (ref 11.4–15.2)

## 2016-08-18 LAB — TROPONIN I: Troponin I: <0.03 ng/mL

## 2016-08-18 NOTE — ED Provider Notes (Signed)
[facility] Emergency Department Provider Note    ____________________________________________   I have reviewed the triage vital signs and the nursing notes.   HISTORY  Chief Complaint Code Stroke   History limited by: Not Limited   HPI [name] is a 36 y.o. female who presents to the emergency department today because of concerns for vision change and right arm numbness. The patient states that she has a history of ocular migraines and started having some pain and discomfort behind her right eye about 4 hours prior to my evaluation. However shortly thereafter she started developing some numbness in her right hand. It started in her thumb and index. Then started tracking up towards her shoulder. She states this has gotten a little bit better since it started. In addition she started having leg weakness bilaterally. The patient denies any recent illness. Stated she felt normal earlier today.     Past Medical History:  Diagnosis Date  . Headache   . Hx of pyelonephritis    x1 in high school  . Hx of varicella     Patient Active Problem List   Diagnosis Date Noted  . Infected sebaceous cyst 10/13/2015    Past Surgical History:  Procedure Laterality Date  . SHOULDER SURGERY  2014   R shoulder    Prior to Admission medications   Medication Sig Start Date End Date Taking? Authorizing Provider  JUNEL FE 1/20 1-20 MG-MCG tablet Take 1 tablet by mouth daily. 10/29/15  Yes [provider]  levothyroxine (SYNTHROID, LEVOTHROID) 137 MCG tablet Take 1 tablet by mouth daily. 08/17/16  Yes [provider]    Allergies Patient has no known allergies.  Family History  Problem Relation Age of Onset  . Diabetes Paternal Grandmother   . Cancer Paternal Grandmother        breast  . Asthma Paternal Grandmother   . Heart attack Mother   . Thyroid disease Mother   . Heart attack Brother   . Thyroid disease Maternal Aunt   . Breast  cancer Maternal Aunt        late 4640's early 7650's  . Thyroid disease Paternal Aunt   . Breast cancer Paternal Aunt        [id number], late 8050's, late 6040's  . Thyroid disease Paternal Uncle   . Heart disease Maternal Grandmother   . Diabetes Maternal Grandmother     Social History Social History  Substance Use Topics  . Smoking status: Never Smoker  . Smokeless tobacco: Never Used  . Alcohol use No    Review of Systems Constitutional: No fever/chills Eyes: Positive for right eye vision change.  ENT: No sore throat. Cardiovascular: Denies chest pain. Respiratory: Denies shortness of breath. Gastrointestinal: No abdominal pain.  No nausea, no vomiting.  No diarrhea.   Genitourinary: Negative for dysuria. Musculoskeletal: Negative for back pain. Skin: Negative for rash. Neurological: Positive for right hand numbness, lower extremity weakness.   ____________________________________________   PHYSICAL EXAM:  VITAL SIGNS: ED Triage Vitals  Enc Vitals Group     BP 08/18/16 2248 (!) 140/96     Pulse Rate 08/18/16 2248 78     Resp 08/18/16 2248 16     Temp 08/18/16 2248 97.8 F (36.6 C)     Temp Source 08/18/16 2248 Oral     SpO2 08/18/16 2248 100 %     Weight 08/18/16 2241 185 lb (83.9 kg)     Height 08/18/16 2241 5\' 2"  (1.575 m)  Constitutional: Alert and oriented. Well appearing and in no distress. Eyes: Conjunctivae are normal.  ENT   Head: Normocephalic and atraumatic.   Nose: No congestion/rhinnorhea.   Mouth/Throat: Mucous membranes are moist.   Neck: No stridor. Hematological/Lymphatic/Immunilogical: No cervical lymphadenopathy. Cardiovascular: Normal rate, regular rhythm.  No murmurs, rubs, or gallops. Respiratory: Normal respiratory effort without tachypnea nor retractions. Breath sounds are clear and equal bilaterally. No wheezes/rales/rhonchi. Gastrointestinal: Soft and non tender. No rebound. No guarding.  Genitourinary: Deferred Musculoskeletal:  Normal range of motion in all extremities. No lower extremity edema. Neurologic:  Normal speech and language. Strength 5/5 in upper and lower extremities bilaterally. Sensation intact. NIHSS 0. No gross focal neurologic deficits are appreciated.  Skin:  Skin is warm, dry and intact. No rash noted. Psychiatric: Mood and affect are normal. Speech and behavior are normal. Patient exhibits appropriate insight and judgment.  ____________________________________________    LABS (pertinent positives/negatives)  Labs Reviewed  DIFFERENTIAL - Abnormal; Notable for the following:       Result Value   Lymphs Abs 3.7 (*)    Monocytes Absolute 1.0 (*)    All other components within normal limits  GLUCOSE, CAPILLARY - Abnormal; Notable for the following:    Glucose-Capillary 102 (*)    All other components within normal limits  PROTIME-INR  APTT  CBC  COMPREHENSIVE METABOLIC PANEL  TROPONIN I  CBG MONITORING, ED     ____________________________________________   EKG  None  ____________________________________________    RADIOLOGY  CT head IMPRESSION:  1. No acute intracranial abnormality identified. Unremarkable CT of  the head.  2. ASPECTS is 10       ____________________________________________   PROCEDURES  Procedures  ____________________________________________   INITIAL IMPRESSION / ASSESSMENT AND PLAN / ED COURSE  Pertinent labs & imaging results that were available during my care of the patient were reviewed by me and considered in my medical decision making (see chart for details).  Patient presented to the emergency department today because of concerns for right arm numbness lower leg weakness in the setting of ocular migraine. CT was negative. Patient was advised by specialist on call he does think this is related to migraine. Patient's symptoms did resolve while she was here in the emergency department. Will plan on discharge and follow-up with  neurology.  ____________________________________________   FINAL CLINICAL IMPRESSION(S) / ED DIAGNOSES  Final diagnoses:  Right arm numbness  Ocular migraine     Note: This dictation was prepared with Dragon dictation. Any transcriptional errors that result from this process are unintentional     Phineas Semen, MD 08/18/16 [phone]

## 2016-08-18 NOTE — Discharge Instructions (Signed)
Please seek medical attention for any high fevers, chest pain, shortness of breath, change in behavior, persistent vomiting, bloody stool or any other new or concerning symptoms.  

## 2016-08-18 NOTE — ED Notes (Signed)
[facility]   [facility]

## 2016-08-18 NOTE — ED Triage Notes (Signed)
Pt to tx room from CT, report at 2200, started having numbness in right thumb and has ascended to right shoulder.  Pt reports hx of ocular migraines, and vision deficits in right eye, thought it was just that but numbness is abnormal. Pt also reports she had aphasia though speaking w/o difficulty now.

## 2016-08-18 NOTE — ED Notes (Signed)
Report given to shift nurse Demetrio LappingKala RN

## 2016-08-19 ENCOUNTER — Ambulatory Visit: Payer: Self-pay | Admitting: Physician Assistant

## 2016-08-19 DIAGNOSIS — Z021 Encounter for pre-employment examination: Secondary | ICD-10-CM

## 2016-08-19 NOTE — Progress Notes (Signed)
See documentation in systoc

## 2016-12-13 ENCOUNTER — Ambulatory Visit: Payer: Self-pay | Admitting: Emergency Medicine

## 2016-12-13 VITALS — BP 120/80 | HR 69 | Temp 98.5°F | Resp 16

## 2016-12-13 DIAGNOSIS — E039 Hypothyroidism, unspecified: Secondary | ICD-10-CM

## 2016-12-13 MED ORDER — LEVOTHYROXINE SODIUM 137 MCG PO TABS: 137.0000 ug | ORAL_TABLET | Freq: Every day | ORAL | 1 refills | Status: AC

## 2016-12-13 NOTE — Progress Notes (Signed)
S:  Medication refill  O:  Lungs clear bilat.  Heart RRR A:  Medication refill P:  Synthroid prescriptions sent to [facility].

## 2016-12-26 ENCOUNTER — Ambulatory Visit: Payer: Self-pay | Admitting: Internal Medicine

## 2017-01-03 DIAGNOSIS — K200 Eosinophilic esophagitis: Secondary | ICD-10-CM

## 2017-01-03 HISTORY — DX: Eosinophilic esophagitis: K20.0

## 2017-01-31 ENCOUNTER — Encounter: Payer: Self-pay | Admitting: Physician Assistant

## 2017-01-31 ENCOUNTER — Ambulatory Visit: Payer: Self-pay | Admitting: Physician Assistant

## 2017-01-31 VITALS — BP 118/78 | HR 74 | Temp 98.2°F

## 2017-01-31 DIAGNOSIS — B349 Viral infection, unspecified: Secondary | ICD-10-CM

## 2017-01-31 MED ORDER — PSEUDOEPH-BROMPHEN-DM 30-2-10 MG/5ML PO SYRP: 5.0000 mL | ORAL_SOLUTION | Freq: Four times a day (QID) | ORAL | 0 refills | Status: DC | PRN

## 2017-01-31 NOTE — Progress Notes (Signed)
   Subjective: Sore throat    Patient ID: [name], female    DOB: 1980/03/03, 37 y.o.   MRN: [id number]  HPI Patient complaint upper respiratory symptoms consisting of nasal congestion intermittent rhinorrhea, postnasal drainage, and sore throat.  Patient states mild nonproductive cough.  Patient denies fever/chills or body aches.  Patient denies nausea, vomiting, diarrhea.  No pulses measured for complaint.  Patient states there was one episode of vertigo 3 days ago when she stood up abruptly.  Patient denies vision disturbance or hearing loss.   Review of Systems    Unremarkable except for complaint Objective:   Physical Exam HEENT reveals mild maxillary guarding and edematous nasal turbinates.  Clear rhinorrhea and postnasal drainage.  Pharynx is nonerythematous.  Neck is supple without adenopathy.  Lungs are clear to auscultation heart is regular rate and rhythm.       Assessment & Plan: Viral respiratory infection and mild pharyngitis.  Patient given discharge care instruction and prescription for Bromfed-DM.  Patient advised to follow-up PCP.

## 2017-04-11 ENCOUNTER — Encounter: Payer: Self-pay | Admitting: Family Medicine

## 2017-04-11 ENCOUNTER — Ambulatory Visit: Payer: Self-pay | Admitting: Family Medicine

## 2017-04-11 VITALS — BP 120/75 | HR 73 | Resp 16

## 2017-04-11 DIAGNOSIS — J0100 Acute maxillary sinusitis, unspecified: Secondary | ICD-10-CM

## 2017-04-11 MED ORDER — AMOXICILLIN-POT CLAVULANATE 875-125 MG PO TABS: 1.0000 | ORAL_TABLET | Freq: Two times a day (BID) | ORAL | 0 refills | Status: AC

## 2017-04-11 NOTE — Progress Notes (Signed)
Subjective: Congestion and dental pain     [name] is a 37 y.o. female who presents for evaluation of nasal congestion with purulent nasal discharge and facial pressure for 11 days.  Reports productive cough  for the last week, which has been mild and is not worsened.  Reports bilateral forehead facial pressure.  Patient reports right upper dental pain for 5 days, which is exacerbated by drinking cold liquids.  Patient reports her dental pain has been improving since the onset, with significant improvement between yesterday and today.  Patient has made an appointment with a dentist this Thursday to evaluate this.  Patient reports that overall her nasal congestion and facial pressure have been worsening since the onset.  Denies fever or chills.  Reports a history of allergic rhinitis, which she takes Zyrtec for.  Reports her symptoms do not currently coincide with allergen exposure. Treatment to date: Zyrtec.  Denies rash, nausea, vomiting, diarrhea, shortness of breath, wheezing, chest or back pain, ear pain, sore throat, difficulty swallowing, confusion, AMS, body aches, fatigue, fever, chills, or severe symptoms. History of smoking, asthma, COPD: Denies.  Patient is taking Qvar for eosinophilic esophagitis. History of recurrent sinus and/or lung infections: Negative. Medical history: Hypothyroidism, hypercholesterolemia, and eosinophilic esophagitis. Antibiotic use in the last month: Negative.   Review of Systems Pertinent items noted in HPI and remainder of comprehensive ROS otherwise negative.     Objective:   Physical Exam General: Awake, alert, and oriented. No acute distress. Well developed, hydrated and nourished. Appears stated age. Nontoxic appearance.  HEENT:  PND noted.  No erythema to posterior oropharynx. No edema or exudates of pharynx or tonsils. No erythema or bulging of TM.  Mild erythema/edema to nasal mucosa.  Bilateral maxillary sinus tenderness.  Remainder of sinuses  nontender. Supple neck without adenopathy. Cardiac: Heart rate and rhythm are normal. No murmurs, gallops, or rubs are auscultated. S1 and S2 are heard and are of normal intensity.  Respiratory: No signs of respiratory distress. Lungs clear. No tachypnea. Able to speak in full sentences without dyspnea. Nonlabored respirations.  Skin: Skin is warm, dry and intact. Appropriate color for ethnicity. No cyanosis noted.   Diagnostic Results: None.  Assessment:    sinusitis   Plan:    Discussed the diagnosis and treatment of sinusitis. Suggested symptomatic OTC remedies. Nasal saline spray for congestion. Augmentin per orders.  Patient tolerated this well before. Discussed side/adverse effects of Augmentin.  Discussed that augmentin decreases the effectiveness of her OCP but she said she stopped taking it because her husband got a vasectomy.  Follow-up with dentist regarding tooth pain. Follow-up with primary care provider. Discussed red flag symptoms and circumstances with which to seek medical care.   New Prescriptions   AMOXICILLIN-CLAVULANATE (AUGMENTIN) 875-125 MG TABLET    Take 1 tablet by mouth 2 (two) times daily for 10 days.

## 2017-07-07 ENCOUNTER — Other Ambulatory Visit: Payer: Self-pay | Admitting: Obstetrics and Gynecology

## 2017-07-07 DIAGNOSIS — Z803 Family history of malignant neoplasm of breast: Secondary | ICD-10-CM

## 2017-07-18 ENCOUNTER — Ambulatory Visit
Admission: RE | Admit: 2017-07-18 | Discharge: 2017-07-18 | Disposition: A | Discharge status: Home / Self Care | Payer: Managed Care, Other (non HMO) | Source: Ambulatory Visit | Attending: Obstetrics and Gynecology | Admitting: Obstetrics and Gynecology

## 2017-07-18 DIAGNOSIS — Z803 Family history of malignant neoplasm of breast: Secondary | ICD-10-CM

## 2017-07-18 MED ORDER — GADOBENATE DIMEGLUMINE 529 MG/ML IV SOLN
19.0000 mL | Freq: Once | INTRAVENOUS | Status: AC | PRN
  Administered 2017-07-18: 19 mL via INTRAVENOUS

## 2017-07-31 ENCOUNTER — Ambulatory Visit: Payer: Self-pay | Admitting: Emergency Medicine

## 2017-07-31 VITALS — BP 127/86 | HR 77 | Temp 98.4°F | Resp 16

## 2017-07-31 DIAGNOSIS — J029 Acute pharyngitis, unspecified: Secondary | ICD-10-CM

## 2017-07-31 DIAGNOSIS — B309 Viral conjunctivitis, unspecified: Secondary | ICD-10-CM

## 2017-07-31 LAB — POCT RAPID STREP A (OFFICE): Rapid Strep A Screen: NEGATIVE

## 2017-07-31 MED ORDER — OFLOXACIN 0.3 % OP SOLN: 1.0000 [drp] | Freq: Four times a day (QID) | OPHTHALMIC | 0 refills | Status: DC

## 2017-07-31 NOTE — Progress Notes (Signed)
Subjective. Patient enters with a 5 day history of sore throat. She has had discomfort in both ears. Her fever subsequently resolved. She has had significant nasal congestion and drainage as well as fullness in both ears. She has had a productive cough at times. Yesterday she developed redness and mattering and discomfort in her right eye. The fever has resolved. The cough has improved. Her main complaint today is a fullness in her ears as well as the rightI eye  mattering and discomfort. Social history. She is a nonsmoker.Se does use Qvar for asthma. Objective. Alert and cooperative not ill-appearing There is significant redness of the conjunctiva of the right eye. There is mattering noted. The cornea itself is clear and the pupil is normal. There is no preauricular node palpable.. TMs are clear. Nose shows congestion. Throat exam reveals visible tonsils but no redness and no exudate. Neck is supple without adenopathy. Chest clear to auscultation. Heart regular rate no murmurs. Skin without rash. Assessment  Patient presents with a respiratory infection and sore throat with now developing significant conjunctivitis of the right eye. Labs. Strep test screen negative. Plan . She will continue Sudafed during the day. Advised Delsym cough syrup at night. Prescription  for Ocuflox eyedrops.

## 2017-07-31 NOTE — Patient Instructions (Addendum)
Please drink good amounts of fluids. Take Sudafed for nasal congestion. Try Delsym to help with cough. I have sent a prescription for eyedrops.     Viral Conjunctivitis, Adult Viral conjunctivitis is an inflammation of the clear membrane that covers the white part of your eye and the inner surface of your eyelid (conjunctiva). The inflammation is caused by a viral infection. The blood vessels in the conjunctiva become inflamed, causing the eye to become red or pink, and often itchy. Viral conjunctivitis can be easily passed from one person to another (is contagious). This condition is often called pink eye. What are the causes? This condition is caused by a virus. A virus is a type of contagious germ. It can be spread by touching objects that have been contaminated with the virus, such as doorknobs or towels. It can also be passed through droplets, such as from coughing or sneezing. What are the signs or symptoms? Symptoms of this condition include:  Eye redness.  Tearing or watery eyes.  Itchy and irritated eyes.  Burning feeling in the eyes.  Clear drainage from the eye.  Swollen eyelids.  A gritty feeling in the eye.  Light sensitivity.  This condition often occurs with other symptoms, such as a fever, nausea, or a rash. How is this diagnosed? This condition is diagnosed with a medical history and physical exam. If you have discharge from your eye, the discharge may be tested to rule out other causes of conjunctivitis. How is this treated? Viral conjunctivitis does not respond to medicines that kill bacteria (antibiotics). Treatment for viral conjunctivitis is directed at stopping a bacterial infection from developing in addition to the viral infection. Treatment also aims to relieve your symptoms, such as itching. This may be done with antihistamine drops or other eye medicines. Rarely, steroid eye drops or antiviral medicines may be prescribed. Follow these instructions at  home: Medicines   Take or apply over-the-counter and prescription medicines only as told by your health care provider.  Be very careful to avoid touching the edge of the eyelid with the eye drop bottle or ointment tube when applying medicines to the affected eye. Being careful this way will stop you from spreading the infection to the other eye or to other people. Eye care  Avoid touching or rubbing your eyes.  Apply a warm, wet, clean washcloth to your eye for 10-20 minutes, 3-4 times per day or as told by your health care provider.  If you wear contact lenses, do not wear them until the inflammation is gone and your health care provider says it is safe to wear them again. Ask your health care provider how to sterilize or replace your contact lenses before using them again. Wear glasses until you can resume wearing contacts.  Avoid wearing eye makeup until the inflammation is gone. Throw away any old eye cosmetics that may be contaminated.  Gently wipe away any drainage from your eye with a warm, wet washcloth or a cotton ball. General instructions  Change or wash your pillowcase every day or as told by your health care provider.  Do not share towels, pillowcases, washcloths, eye makeup, makeup brushes, contact lenses, or glasses. This may spread the infection.  Wash your hands often with soap and water. Use paper towels to dry your hands. If soap and water are not available, use hand sanitizer.  Try to avoid contact with other people for one week or as told by your health care provider. Contact a health care  provider if:  Your symptoms do not improve with treatment or they get worse.  You have increased pain.  Your vision becomes blurry.  You have a fever.  You have facial pain, redness, or swelling.  You have yellow or green drainage coming from your eye.  You have new symptoms. This information is not intended to replace advice given to you by your health care provider.  Make sure you discuss any questions you have with your health care provider. Document Released: 03/12/2002 Document Revised: 07/18/2015 Document Reviewed: 07/07/2015 Elsevier Interactive Patient Education  [employer]2018 [employer]

## 2017-08-02 ENCOUNTER — Ambulatory Visit: Payer: Self-pay | Admitting: Emergency Medicine

## 2017-08-02 ENCOUNTER — Encounter: Payer: Self-pay | Admitting: Emergency Medicine

## 2017-08-02 DIAGNOSIS — H6691 Otitis media, unspecified, right ear: Secondary | ICD-10-CM

## 2017-08-02 DIAGNOSIS — J0100 Acute maxillary sinusitis, unspecified: Secondary | ICD-10-CM

## 2017-08-02 MED ORDER — FLUTICASONE PROPIONATE 50 MCG/ACT NA SUSP: NASAL | 0 refills | Status: AC

## 2017-08-02 MED ORDER — AMOXICILLIN 875 MG PO TABS: ORAL_TABLET | ORAL | 0 refills | Status: DC

## 2017-08-02 NOTE — Patient Instructions (Addendum)
You have a sinus infection and double ear infection (right ear worse than left). The conjunctivitis right eye is improving, so continue the eyedrops prescribed Monday.-the red in your right eye is not serious,it is just from a tiny capillary that first, and may take a couple weeks for all the red to go away.  I've sent prescriptions to your pharmacy for amoxicillin and Flonase. May continue over-the-counter decongestant. May continue Zyrtec for allergies as long as that doesn't over dry you.  Please review test instruction sheets.  Follow-up with PCP if not better in one week, sooner if worse or new symptoms.  Sinusitis, Adult Sinusitis is soreness and inflammation of your sinuses. Sinuses are hollow spaces in the bones around your face. Your sinuses are located:  Around your eyes.  In the middle of your forehead.  Behind your nose.  In your cheekbones.  Your sinuses and nasal passages are lined with a stringy fluid (mucus). Mucus normally drains out of your sinuses. When your nasal tissues become inflamed or swollen, the mucus can become trapped or blocked so air cannot flow through your sinuses. This allows bacteria, viruses, and funguses to grow, which leads to infection. Sinusitis can develop quickly and last for 7?10 days (acute) or for more than 12 weeks (chronic). Sinusitis often develops after a cold. What are the causes? This condition is caused by anything that creates swelling in the sinuses or stops mucus from draining, including:  Allergies.  Asthma.  Bacterial or viral infection.  Abnormally shaped bones between the nasal passages.  Nasal growths that contain mucus (nasal polyps).  Narrow sinus openings.  Pollutants, such as chemicals or irritants in the air.  A foreign object stuck in the nose.  A fungal infection. This is rare.  What increases the risk? The following factors may make you more likely to develop this condition:  Having allergies or  asthma.  Having had a recent cold or respiratory tract infection.  Having structural deformities or blockages in your nose or sinuses.  Having a weak immune system.  Doing a lot of swimming or diving.  Overusing nasal sprays.  Smoking.  What are the signs or symptoms? The main symptoms of this condition are pain and a feeling of pressure around the affected sinuses. Other symptoms include:  Upper toothache.  Earache.  Headache.  Bad breath.  Decreased sense of smell and taste.  A cough that may get worse at night.  Fatigue.  Fever.  Thick drainage from your nose. The drainage is often green and it may contain pus (purulent).  Stuffy nose or congestion.  Postnasal drip. This is when extra mucus collects in the throat or back of the nose.  Swelling and warmth over the affected sinuses.  Sore throat.  Sensitivity to light.  How is this diagnosed? This condition is diagnosed based on symptoms, a medical history, and a physical exam. To find out if your condition is acute or chronic, your health care provider may:  Look in your nose for signs of nasal polyps.  Tap over the affected sinus to check for signs of infection.  View the inside of your sinuses using an imaging device that has a light attached (endoscope).  If your health care provider suspects that you have chronic sinusitis, you may also:  Be tested for allergies.  Have a sample of mucus taken from your nose (nasal culture) and checked for bacteria.  Have a mucus sample examined to see if your sinusitis is related to  an allergy.  If your sinusitis does not respond to treatment and it lasts longer than 8 weeks, you may have an MRI or CT scan to check your sinuses. These scans also help to determine how severe your infection is. In rare cases, a bone biopsy may be done to rule out more serious types of fungal sinus disease. How is this treated? Treatment for sinusitis depends on the cause and  whether your condition is chronic or acute. If a virus is causing your sinusitis, your symptoms will go away on their own within 10 days. You may be given medicines to relieve your symptoms, including:  Topical nasal decongestants. They shrink swollen nasal passages and let mucus drain from your sinuses.  Antihistamines. These drugs block inflammation that is triggered by allergies. This can help to ease swelling in your nose and sinuses.  Topical nasal corticosteroids. These are nasal sprays that ease inflammation and swelling in your nose and sinuses.  Nasal saline washes. These rinses can help to get rid of thick mucus in your nose.  If your condition is caused by bacteria, you will be given an antibiotic medicine. If your condition is caused by a fungus, you will be given an antifungal medicine. Surgery may be needed to correct underlying conditions, such as narrow nasal passages. Surgery may also be needed to remove polyps. Follow these instructions at home: Medicines  Take, use, or apply over-the-counter and prescription medicines only as told by your health care provider. These may include nasal sprays.  If you were prescribed an antibiotic medicine, take it as told by your health care provider. Do not stop taking the antibiotic even if you start to feel better. Hydrate and Humidify  Drink enough water to keep your urine clear or pale yellow. Staying hydrated will help to thin your mucus.  Use a cool mist humidifier to keep the humidity level in your home above 50%.  Inhale steam for 10-15 minutes, 3-4 times a day or as told by your health care provider. You can do this in the bathroom while a hot shower is running.  Limit your exposure to cool or dry air. Rest  Rest as much as possible.  Sleep with your head raised (elevated).  Make sure to get enough sleep each night. General instructions  Apply a warm, moist washcloth to your face 3-4 times a day or as told by your  health care provider. This will help with discomfort.  Wash your hands often with soap and water to reduce your exposure to viruses and other germs. If soap and water are not available, use hand sanitizer.  Do not smoke. Avoid being around people who are smoking (secondhand smoke).  Keep all follow-up visits as told by your health care provider. This is important. Contact a health care provider if:  You have a fever.  Your symptoms get worse.  Your symptoms do not improve within 10 days. Get help right away if:  You have a severe headache.  You have persistent vomiting.  You have pain or swelling around your face or eyes.  You have vision problems.  You develop confusion.  Your neck is stiff.  You have trouble breathing. This information is not intended to replace advice given to you by your health care provider. Make sure you discuss any questions you have with your health care provider. Document Released: 12/20/2004 Document Revised: 08/16/2015 Document Reviewed: 10/15/2014 Elsevier Interactive Patient Education  2018 [employer]  Subconjunctival Hemorrhage Subconjunctival hemorrhage is  bleeding that happens between the white part of your eye (sclera) and the clear membrane that covers the outside of your eye (conjunctiva). There are many tiny blood vessels near the surface of your eye. A subconjunctival hemorrhage happens when one or more of these vessels breaks and bleeds, causing a red patch to appear on your eye. This is similar to a bruise. Depending on the amount of bleeding, the red patch may only cover a small area of your eye or it may cover the entire visible part of the sclera. If a lot of blood collects under the conjunctiva, there may also be swelling. Subconjunctival hemorrhages do not affect your vision or cause pain, but your eye may feel irritated if there is swelling. Subconjunctival hemorrhages usually do not require treatment, and they disappear on their  own within two weeks. What are the causes? This condition may be caused by:  Mild trauma, such as rubbing your eye too hard.  Severe trauma or blunt injuries.  Coughing, sneezing, or vomiting.  Straining, such as when lifting a heavy object.  High blood pressure.  Recent eye surgery.  A history of diabetes.  Certain medicines, especially blood thinners (anticoagulants).  Other conditions, such as eye tumors, bleeding disorders, or blood vessel abnormalities.  Subconjunctival hemorrhages can happen without an obvious cause. What are the signs or symptoms? Symptoms of this condition include:  A bright red or dark red patch on the white part of the eye. ? The red area may spread out to cover a larger area of the eye before it goes away. ? The red area may turn brownish-yellow before it goes away.  Swelling.  Mild eye irritation.  How is this diagnosed? This condition is diagnosed with a physical exam. If your subconjunctival hemorrhage was caused by trauma, your health care provider may refer you to an eye specialist (ophthalmologist) or another specialist to check for other injuries. You may have other tests, including:  An eye exam.  A blood pressure check.  Blood tests to check for bleeding disorders.  If your subconjunctival hemorrhage was caused by trauma, X-rays or a CT scan may be done to check for other injuries. How is this treated? Usually, no treatment is needed. Your health care provider may recommend eye drops or cold compresses to help with discomfort. Follow these instructions at home:  Take over-the-counter and prescription medicines only as directed by your health care provider.  Use eye drops or cold compresses to help with discomfort as directed by your health care provider.  Avoid activities, things, and environments that may irritate or injure your eye.  Keep all follow-up visits as told by your health care provider. This is important. Contact  a health care provider if:  You have pain in your eye.  The bleeding does not go away within 3 weeks.  You keep getting new subconjunctival hemorrhages. Get help right away if:  Your vision changes or you have difficulty seeing.  You suddenly develop severe sensitivity to light.  You develop a severe headache, persistent vomiting, confusion, or abnormal tiredness (lethargy).  Your eye seems to bulge or protrude from your eye socket.  You develop unexplained bruises on your body.  You have unexplained bleeding in another area of your body. This information is not intended to replace advice given to you by your health care provider. Make sure you discuss any questions you have with your health care provider. Document Released: 12/20/2004 Document Revised: 08/16/2015 Document Reviewed: 02/26/2014 Elsevier Interactive  Patient Education  2018 [employer] Inc.  

## 2017-08-02 NOTE — Progress Notes (Signed)
Subjective: Was seen 2 days ago for sore throat and URI, diagnosed with right conjunctivitis and ocular Floxin drops were started. Rapid strep test was negative. Since then, right eye discharge and irritation is significantly improving, although she is noted some red in the right lateral aspect of sclera ,nonpainful without any vision problem. The total duration of URI is at least 8 days, she states,and now progressively worsening.   CC: Facial/sinus pressure with discolored nasal mucus. Severity: progressively worsening Tried OTC meds without significant relief.  + Fever  + URI prodrome with nasal congestion + Minimal swollen neck glands + moderate Sinus Headache + moderate to severe ear pressure, worse on the right. Denies ear drainage.  Has seasonal Allergy symptoms No significant Sore Throat t this time. No significant Cough No chest pain No shortness of breath  No wheezing No Abdominal Pain No Nausea No Vomiting No diarrhea No Myalgias No focal neurologic symptoms No syncope No Rash No Urinary symptoms  Remainder of Review of Systems negative except as noted in the HPI.  Objective: Alert, pleasant female no distress BP 138/85, pulse 70, temp 97.6, respiratory rate 16, O2 saturation 98 percent room air TMs: Right TM red, mildly bulging, mildly distorted ,intact. Left TM: Mildly red, normal landmarks except mild air fluid level. Nose:Boggy turbinates with seromucoid drainage. Face: Mild maxillary tenderness bilaterally Eyes: Small  Subconjunctival hemorrhage in the right lateral sclera, but remainder of right eye exam normal. EOMI. PERRLA. Conjunctiva minimally inflamed but no discharge. Anterior chamber clear. Visual acuity each eye grossly 20/20. left eye exam within normal limits without redness or drainage. Neck: Supple, no adenopathy Lungs, clear to auscultation  Assessment: May have had viral and/or allergic URI prodrome for one week, now with sinusitis and right  otitis media. Also, subacute allergic rhinitis  Plans: Amoxicillin 875 twice a day for 10 days Flonase. Other symptomatic care discussed. Questions invited and answered. Also, AVS printed and given to patient and discussed handouts. Follow-up with PCP if no better one week. Red Flags discussed. The patient was given clear instructions to go to ER or return to medical center if any red flags develop, symptoms do not improve, worsen or new problems develop. They verbalized understanding.

## 2017-08-09 ENCOUNTER — Ambulatory Visit
Admission: RE | Admit: 2017-08-09 | Discharge: 2017-08-09 | Disposition: A | Discharge status: Home / Self Care | Payer: Managed Care, Other (non HMO) | Source: Ambulatory Visit | Attending: Unknown Physician Specialty | Admitting: Unknown Physician Specialty

## 2017-08-09 ENCOUNTER — Ambulatory Visit: Payer: Managed Care, Other (non HMO) | Admitting: Anesthesiology

## 2017-08-09 ENCOUNTER — Encounter: Payer: Self-pay | Admitting: *Deleted

## 2017-08-09 ENCOUNTER — Encounter
Admission: RE | Disposition: A | Discharge status: Home / Self Care | Payer: Self-pay | Source: Ambulatory Visit | Attending: Unknown Physician Specialty

## 2017-08-09 DIAGNOSIS — K200 Eosinophilic esophagitis: Secondary | ICD-10-CM | POA: Insufficient documentation

## 2017-08-09 DIAGNOSIS — Z79899 Other long term (current) drug therapy: Secondary | ICD-10-CM | POA: Diagnosis not present

## 2017-08-09 DIAGNOSIS — E039 Hypothyroidism, unspecified: Secondary | ICD-10-CM | POA: Insufficient documentation

## 2017-08-09 HISTORY — DX: Hypothyroidism, unspecified: E03.9

## 2017-08-09 HISTORY — PX: ESOPHAGOGASTRODUODENOSCOPY (EGD) WITH PROPOFOL: SHX5813

## 2017-08-09 LAB — POCT PREGNANCY, URINE: Preg Test, Ur: NEGATIVE

## 2017-08-09 SURGERY — ESOPHAGOGASTRODUODENOSCOPY (EGD) WITH PROPOFOL
Anesthesia: General

## 2017-08-09 MED ORDER — PROPOFOL 10 MG/ML IV BOLUS
INTRAVENOUS | Status: DC | PRN
  Administered 2017-08-09: 100 mg via INTRAVENOUS

## 2017-08-09 MED ORDER — FENTANYL CITRATE (PF) 100 MCG/2ML IJ SOLN
INTRAMUSCULAR | Status: DC | PRN
  Administered 2017-08-09 (×2): 50 ug via INTRAVENOUS

## 2017-08-09 MED ORDER — PROPOFOL 500 MG/50ML IV EMUL
INTRAVENOUS | Status: DC | PRN
  Administered 2017-08-09: 160 ug/kg/min via INTRAVENOUS

## 2017-08-09 MED ORDER — SODIUM CHLORIDE 0.9 % IV SOLN: INTRAVENOUS | Status: DC

## 2017-08-09 MED ORDER — FENTANYL CITRATE (PF) 100 MCG/2ML IJ SOLN
INTRAMUSCULAR | Status: AC
  Filled 2017-08-09: qty 2

## 2017-08-09 MED ORDER — SODIUM CHLORIDE 0.9 % IV SOLN
INTRAVENOUS | Status: DC
  Administered 2017-08-09: 1000 mL via INTRAVENOUS

## 2017-08-09 MED ORDER — GLYCOPYRROLATE 0.2 MG/ML IJ SOLN
INTRAMUSCULAR | Status: DC | PRN
  Administered 2017-08-09: 0.2 mg via INTRAVENOUS

## 2017-08-09 NOTE — Anesthesia Postprocedure Evaluation (Signed)
Anesthesia Post Note  Patient: [name]  Procedure(s) Performed: ESOPHAGOGASTRODUODENOSCOPY (EGD) WITH PROPOFOL (N/A )  Patient location during evaluation: Endoscopy Anesthesia Type: General Level of consciousness: awake and alert Pain management: pain level controlled Vital Signs Assessment: post-procedure vital signs reviewed and stable Respiratory status: spontaneous breathing, nonlabored ventilation and respiratory function stable Cardiovascular status: blood pressure returned to baseline and stable Postop Assessment: no apparent nausea or vomiting Anesthetic complications: no     Last Vitals:  Vitals:   08/09/17 1212 08/09/17 1222  BP: 112/76 120/80  Pulse: (!) 56 (!) 51  Resp: 17 15  Temp:    SpO2: 100% 100%    Last Pain:  Vitals:   08/09/17 1222  TempSrc:   PainSc: 0-No pain                 Christia ReadingScott T Edessa Jakubowicz

## 2017-08-09 NOTE — Anesthesia Post-op Follow-up Note (Signed)
Anesthesia QCDR form completed.        

## 2017-08-09 NOTE — Anesthesia Preprocedure Evaluation (Signed)
Anesthesia Evaluation  Patient identified by MRN, date of birth, ID band Patient awake  General Assessment Comment:Water at 0900  Reviewed: Allergy & Precautions, H&P , NPO status , reviewed documented beta blocker date and time   Airway Mallampati: II  TM Distance: >3 FB Neck ROM: full    Dental  (+) Teeth Intact   Pulmonary    Pulmonary exam normal        Cardiovascular Normal cardiovascular exam     Neuro/Psych  Headaches,    GI/Hepatic   Endo/Other  Hypothyroidism   Renal/GU      Musculoskeletal   Abdominal   Peds  Hematology   Anesthesia Other Findings Past Medical History: No date: Headache No date: Hx of pyelonephritis     Comment:  x1 in high school No date: Hx of varicella No date: Hypothyroidism  Past Surgical History: 2014: SHOULDER SURGERY     Comment:  R shoulder     Reproductive/Obstetrics                             Anesthesia Physical Anesthesia Plan  ASA: II  Anesthesia Plan: General   Post-op Pain Management:    Induction: Intravenous  PONV Risk Score and Plan: Treatment may vary due to age or medical condition and TIVA  Airway Management Planned: Nasal Cannula and Natural Airway  Additional Equipment:   Intra-op Plan:   Post-operative Plan:   Informed Consent: I have reviewed the patients History and Physical, chart, labs and discussed the procedure including the risks, benefits and alternatives for the proposed anesthesia with the patient or authorized representative who has indicated his/her understanding and acceptance.   Dental Advisory Given  Plan Discussed with: CRNA  Anesthesia Plan Comments:         Anesthesia Quick Evaluation

## 2017-08-09 NOTE — Transfer of Care (Signed)
Immediate Anesthesia Transfer of Care Note  Patient: [name]  Procedure(s) Performed: ESOPHAGOGASTRODUODENOSCOPY (EGD) WITH PROPOFOL (N/A )  Patient Location: PACU and Endoscopy Unit  Anesthesia Type:General  Level of Consciousness: awake and patient cooperative  Airway & Oxygen Therapy: Patient Spontanous Breathing and Patient connected to nasal cannula oxygen  Post-op Assessment: Report given to RN and Post -op Vital signs reviewed and stable  Post vital signs: Reviewed and stable  Last Vitals:  Vitals Value Taken Time  BP    Temp    Pulse    Resp    SpO2      Last Pain:  Vitals:   08/09/17 1105  TempSrc: Tympanic  PainSc: 0-No pain         Complications: No apparent anesthesia complications

## 2017-08-09 NOTE — H&P (Signed)
Primary Care Physician:  Kandyce Rud, MD Primary Gastroenterologist:  Dr. Mechele Collin  Pre-Procedure History & Physical: HPI:  [name] is a 37 y.o. female is here for an endoscopy.  Follow up for eosinophilic esophagitis.   Past Medical History:  Diagnosis Date  . Headache   . Hx of pyelonephritis    x1 in high school  . Hx of varicella   . Hypothyroidism     Past Surgical History:  Procedure Laterality Date  . SHOULDER SURGERY  2014   R shoulder    Prior to Admission medications   Medication Sig Start Date End Date Taking? Authorizing Provider  amoxicillin (AMOXIL) 875 MG tablet Take 1 twice a day X 10 days. 08/02/17  Yes Lajean Manes, MD  Ascorbic Acid (VITAMIN C) 1000 MG tablet Take 1,000 mg by mouth daily.   Yes [provider]  calcium carbonate (OSCAL) 1500 (600 Ca) MG TABS tablet Take by mouth 2 (two) times daily with a meal.   Yes [provider]  levothyroxine (SYNTHROID, LEVOTHROID) 137 MCG tablet Take 1 tablet (137 mcg total) by mouth daily. 12/13/16  Yes Tommi Rumps, PA-C  Multiple Vitamins-Minerals (MULTIVITAMIN WITH MINERALS) tablet Take 1 tablet by mouth daily.   Yes [provider]  sucralfate (CARAFATE) 1 g tablet Take 1 g by mouth 4 (four) times daily -  with meals and at bedtime.   Yes [provider]  escitalopram (LEXAPRO) 10 MG tablet Take 10 mg by mouth daily.    [provider]  fluticasone Aleda Grana) 50 MCG/ACT nasal spray 1 or 2 sprays each nostril twice a day 08/02/17   Lajean Manes, MD  ofloxacin (OCUFLOX) 0.3 % ophthalmic solution Place 1 drop into the right eye 4 (four) times daily. 07/31/17   Collene Gobble, MD  omeprazole (PRILOSEC) 40 MG capsule Take 40 mg by mouth 2 (two) times daily.    [provider]  Rosalene Billings 80 MCG/ACT inhaler  06/07/17   [provider]    Allergies as of 05/30/2017  . (No Known Allergies)    Family History  Problem Relation Age of Onset   . Diabetes Paternal Grandmother   . Cancer Paternal Grandmother        breast  . Asthma Paternal Grandmother   . Heart attack Mother   . Thyroid disease Mother   . Heart attack Brother   . Thyroid disease Maternal Aunt   . Breast cancer Maternal Aunt        late 55's early 58's  . Thyroid disease Paternal Aunt   . Breast cancer Paternal Aunt        x3 56, late 40's, late 86's  . Thyroid disease Paternal Uncle   . Heart disease Maternal Grandmother   . Diabetes Maternal Grandmother     Social History   Socioeconomic History  . Marital status: Married    Spouse name: Not on file  . Number of children: Not on file  . Years of education: Not on file  . Highest education level: Not on file  Occupational History  . Not on file  Social Needs  . Financial resource strain: Not on file  . Food insecurity:    Worry: Not on file    Inability: Not on file  . Transportation needs:    Medical: Not on file    Non-medical: Not on file  Tobacco Use  . Smoking status: Never Smoker  . Smokeless tobacco: Never Used  Substance and Sexual Activity  . Alcohol use: No  . Drug use: No  . Sexual activity: Yes  Lifestyle  . Physical activity:    Days per week: Not on file    Minutes per session: Not on file  . Stress: Not on file  Relationships  . Social connections:    Talks on phone: Not on file    Gets together: Not on file    Attends religious service: Not on file    Active member of club or organization: Not on file    Attends meetings of clubs or organizations: Not on file    Relationship status: Not on file  . Intimate partner violence:    Fear of current or ex partner: Not on file    Emotionally abused: Not on file    Physically abused: Not on file    Forced sexual activity: Not on file  Other Topics Concern  . Not on file  Social History Narrative  . Not on file    Review of Systems: See HPI, otherwise negative ROS  Physical Exam: BP 119/85   Pulse 68   Temp  (!) 97.1 F (36.2 C) (Tympanic)   Resp 16   Ht 5\' 3"  (1.6 m)   Wt 91.6 kg (202 lb)   SpO2 100%   BMI 35.78 kg/m  General:   Alert,  pleasant and cooperative in NAD Head:  Normocephalic and atraumatic. Neck:  Supple; no masses or thyromegaly. Lungs:  Clear throughout to auscultation.    Heart:  Regular rate and rhythm. Abdomen:  Soft, nontender and nondistended. Normal bowel sounds, without guarding, and without rebound.   Neurologic:  Alert and  oriented x4;  grossly normal neurologically.  Impression/Plan: [name] is here for an endoscopy to be performed for follow up eosinophilic esophagitis after steroid treatment.  Risks, benefits, limitations, and alternatives regarding  endoscopy have been reviewed with the patient.  Questions have been answered.  All parties agreeable.   [name], Stephanie Fussner, MD  08/09/2017, 11:38 AM

## 2017-08-09 NOTE — Op Note (Signed)
[facility] Gastroenterology Patient Name: [name] Procedure Date: 08/09/2017 11:33 AM MRN: [id number] Account #: [id number] Date of Birth: Sep 24, 1980 Admit Type: Outpatient Age: [age] Room: [facility] ENDO ROOM 3 Gender: Female Note Status: Finalized Procedure:            Upper GI endoscopy Indications:          Follow-up of eosinophilic esophagitis Providers:            Scot Junobert T. Elliott, MD Referring MD:         Hassell HalimMarcus E. Babaoff MD (Referring MD) Medicines:            Propofol per Anesthesia Complications:        No immediate complications. Procedure:            Pre-Anesthesia Assessment:                       - After reviewing the risks and benefits, the patient                        was deemed in satisfactory condition to undergo the                        procedure.                       After obtaining informed consent, the endoscope was                        passed under direct vision. Throughout the procedure,                        the patient's blood pressure, pulse, and oxygen                        saturations were monitored continuously. The Endoscope                        was introduced through the mouth, and advanced to the                        second part of duodenum. The upper GI endoscopy was                        accomplished without difficulty. The patient tolerated                        the procedure well. Findings:      Mucosal changes including Improved mucosa since being on treatment. were       found in the middle third of the esophagus and in the lower third of the       esophagus. Esophageal findings were graded using the Eosinophilic       Esophagitis Endoscopic Reference Score (EoE-EREFS) as: Edema Grade 1       Present (decreased clarity or absence of vascular markings), Rings Grade       0 None (no ridges or rings seen), Exudates Grade 0 None (no white       lesions seen), Furrows Grade 0 None (no vertical lines seen) and    Stricture none (no stricture found). Biopsies were obtained from the       middle  and distal esophagus with cold forceps for histology of Improved       suspected eosinophilic esophagitis.      The stomach was normal.      The examined duodenum was normal. Impression:           - Esophageal mucosal changes consistent with                        eosinophilic esophagitis. Biopsied.                       - Normal stomach.                       - Normal examined duodenum. Recommendation:       - Await pathology results. Scot Jun, MD 08/09/2017 12:01:08 PM This report has been signed electronically. Number of Addenda: 0 Note Initiated On: 08/09/2017 11:33 AM      [facility]

## 2017-08-10 LAB — SURGICAL PATHOLOGY

## 2020-02-09 ENCOUNTER — Other Ambulatory Visit: Payer: Self-pay

## 2020-02-09 ENCOUNTER — Inpatient Hospital Stay (HOSPITAL_COMMUNITY): Payer: Managed Care, Other (non HMO)

## 2020-02-09 ENCOUNTER — Inpatient Hospital Stay (HOSPITAL_COMMUNITY)
Admission: AD | Admit: 2020-02-09 | Discharge: 2020-02-09 | Disposition: A | Discharge status: Home / Self Care | Payer: Managed Care, Other (non HMO) | Attending: Obstetrics and Gynecology | Admitting: Obstetrics and Gynecology

## 2020-02-09 ENCOUNTER — Encounter (HOSPITAL_COMMUNITY): Payer: Self-pay | Admitting: Obstetrics and Gynecology

## 2020-02-09 DIAGNOSIS — O039 Complete or unspecified spontaneous abortion without complication: Secondary | ICD-10-CM

## 2020-02-09 DIAGNOSIS — Z79899 Other long term (current) drug therapy: Secondary | ICD-10-CM | POA: Diagnosis not present

## 2020-02-09 DIAGNOSIS — Z79890 Hormone replacement therapy (postmenopausal): Secondary | ICD-10-CM | POA: Diagnosis not present

## 2020-02-09 DIAGNOSIS — O209 Hemorrhage in early pregnancy, unspecified: Secondary | ICD-10-CM | POA: Diagnosis present

## 2020-02-09 DIAGNOSIS — Z7951 Long term (current) use of inhaled steroids: Secondary | ICD-10-CM | POA: Diagnosis not present

## 2020-02-09 LAB — URINALYSIS, ROUTINE W REFLEX MICROSCOPIC
Bacteria, UA: NONE SEEN
Bilirubin Urine: NEGATIVE
Glucose, UA: NEGATIVE mg/dL
Ketones, ur: NEGATIVE mg/dL
Leukocytes,Ua: NEGATIVE
Nitrite: NEGATIVE
Protein, ur: NEGATIVE mg/dL
RBC / HPF: >50 RBC/hpf — ABNORMAL HIGH (ref 0–5)
Specific Gravity, Urine: 1.016 (ref 1.005–1.030)
pH: 7 (ref 5.0–8.0)

## 2020-02-09 LAB — CBC
HCT: 44.8 % (ref 36.0–46.0)
Hemoglobin: 14.7 g/dL (ref 12.0–15.0)
MCH: 28.7 pg (ref 26.0–34.0)
MCHC: 32.8 g/dL (ref 30.0–36.0)
MCV: 87.3 fL (ref 80.0–100.0)
Platelets: 362 10*3/uL (ref 150–400)
RBC: 5.13 MIL/uL — ABNORMAL HIGH (ref 3.87–5.11)
RDW: 12.7 % (ref 11.5–15.5)
WBC: 10.6 10*3/uL — ABNORMAL HIGH (ref 4.0–10.5)
nRBC: 0 % (ref 0.0–0.2)

## 2020-02-09 LAB — POCT PREGNANCY, URINE: Preg Test, Ur: POSITIVE — AB

## 2020-02-09 LAB — HCG, QUANTITATIVE, PREGNANCY: hCG, Beta Chain, Quant, S: 1940 m[IU]/mL — ABNORMAL HIGH (ref <5)

## 2020-02-09 MED ORDER — LACTATED RINGERS IV BOLUS
1000.0000 mL | Freq: Once | INTRAVENOUS | Status: AC
  Administered 2020-02-09: 1000 mL via INTRAVENOUS

## 2020-02-09 MED ORDER — IBUPROFEN 600 MG PO TABS: 600.0000 mg | ORAL_TABLET | Freq: Four times a day (QID) | ORAL | 1 refills | Status: DC | PRN

## 2020-02-09 NOTE — MAU Provider Note (Signed)
Chief Complaint: Vaginal Bleeding   Event Date/Time   First Provider Initiated Contact with Patient 02/09/20 1240      SUBJECTIVE HPI: [name] is a 40 y.o. G3P1011 at [redacted]w[redacted]d by LMP with confirmed IUP in office by [place] who presents to maternity admissions reporting onset of light bleeding 2 days ago that became heavier last night with large clots. She felt a pop and some fluid and blood came out, enough to run down her leg and bleeding like a heavy period has continued since then.  The bleeding is associated with menstrual-like cramping.   Location: low abdomen Quality: cramping Severity: 5/10 on pain scale Duration: 1 day Timing: intermittent Modifying factors: none Associated signs and symptoms: vaginal bleeding  HPI  Past Medical History:  Diagnosis Date  . EE (eosinophilic esophagitis) 2019  . Headache   . Hx of pyelonephritis    x1 in high school  . Hx of varicella   . Hypothyroidism    Past Surgical History:  Procedure Laterality Date  . ESOPHAGOGASTRODUODENOSCOPY (EGD) WITH PROPOFOL N/A 08/09/2017   Procedure: ESOPHAGOGASTRODUODENOSCOPY (EGD) WITH PROPOFOL;  Surgeon: Scot Jun, MD;  Location: [facility] ENDOSCOPY;  Service: Endoscopy;  Laterality: N/A;  . SHOULDER SURGERY  2014   R shoulder   Social History   Socioeconomic History  . Marital status: Legally Separated    Spouse name: Not on file  . Number of children: Not on file  . Years of education: Not on file  . Highest education level: Not on file  Occupational History  . Not on file  Tobacco Use  . Smoking status: Never Smoker  . Smokeless tobacco: Never Used  Substance and Sexual Activity  . Alcohol use: Not Currently  . Drug use: No  . Sexual activity: Yes  Other Topics Concern  . Not on file  Social History Narrative  . Not on file   Social Determinants of Health   Financial Resource Strain: Not on file  Food Insecurity: Not on file  Transportation Needs: Not on file  Physical  Activity: Not on file  Stress: Not on file  Social Connections: Not on file  Intimate Partner Violence: Not on file   No current facility-administered medications on file prior to encounter.   Current Outpatient Medications on File Prior to Encounter  Medication Sig Dispense Refill  . ARMOUR THYROID PO Take 105 mg by mouth daily.    Marland Kitchen escitalopram (LEXAPRO) 10 MG tablet Take 10 mg by mouth daily.    . Multiple Vitamins-Minerals (MULTIVITAMIN WITH MINERALS) tablet Take 1 tablet by mouth daily.    Marland Kitchen omeprazole (PRILOSEC) 40 MG capsule Take 40 mg by mouth 2 (two) times daily.    Marland Kitchen amoxicillin (AMOXIL) 875 MG tablet Take 1 twice a day X 10 days. 20 tablet 0  . Ascorbic Acid (VITAMIN C) 1000 MG tablet Take 1,000 mg by mouth daily.    . calcium carbonate (OSCAL) 1500 (600 Ca) MG TABS tablet Take by mouth 2 (two) times daily with a meal.    . fluticasone (FLONASE) 50 MCG/ACT nasal spray 1 or 2 sprays each nostril twice a day 16 g 0  . levothyroxine (SYNTHROID, LEVOTHROID) 137 MCG tablet Take 1 tablet (137 mcg total) by mouth daily. 90 tablet 1  . ofloxacin (OCUFLOX) 0.3 % ophthalmic solution Place 1 drop into the right eye 4 (four) times daily. 5 mL 0  . QVAR REDIHALER 80 MCG/ACT inhaler     . sucralfate (CARAFATE) 1 g  tablet Take 1 g by mouth 4 (four) times daily -  with meals and at bedtime.     No Known Allergies  ROS:  Review of Systems  Constitutional: Negative for chills, fatigue and fever.  Respiratory: Negative for shortness of breath.   Cardiovascular: Negative for chest pain.  Gastrointestinal: Positive for abdominal pain. Negative for nausea and vomiting.  Genitourinary: Positive for pelvic pain and vaginal bleeding. Negative for difficulty urinating, dysuria, flank pain, vaginal discharge and vaginal pain.  Neurological: Negative for dizziness and headaches.  Psychiatric/Behavioral: Negative.      I have reviewed patient's Past Medical Hx, Surgical Hx, Family Hx, Social Hx,  medications and allergies.   Physical Exam   Patient Vitals for the past 24 hrs:  BP Temp Temp src Pulse Resp SpO2 Height Weight  02/09/20 1445 127/72 -- -- 68 -- 100 % -- --  02/09/20 1430 123/75 -- -- 64 -- 100 % -- --  02/09/20 1408 132/70 -- -- 69 -- 100 % -- --  02/09/20 1400 -- -- -- -- -- 100 % -- --  02/09/20 1330 105/61 -- -- 72 -- 97 % -- --  02/09/20 1314 100/65 -- -- 67 -- -- -- --  02/09/20 1055 (!) 137/96 98.4 F (36.9 C) Oral (!) 102 18 99 % -- --  02/09/20 1050 -- -- -- -- -- -- 5\' 3"  (1.6 m) 101 kg   Constitutional: Well-developed, well-nourished female in no acute distress.  Cardiovascular: normal rate Respiratory: normal effort GI: Abd soft, non-tender. Pos BS x 4 MS: Extremities nontender, no edema, normal ROM Neurologic: Alert and oriented x 4.  GU: Neg CVAT.  PELVIC EXAM: Cervix pink, visually closed, without lesion, large dark red clot at cervical os, gently pulled clot using ring forceps and pt reported feeling lightheaded and like she might pass out, exam ended prior to removal of clot  Following ultrasound, discussed options with pt including giving cytotec in MAU or repeating pelvic exam to see if clot can be removed.  Pt preferred to repeat exam so SSE repeated, and large gray clot, c/w POCs, removed from os with minimal bleeding afterwards.  Pad count after second exam, scant bleeding on pad at 30-45 minutes after exam.    LAB RESULTS Results for orders placed or performed during the hospital encounter of 02/09/20 (from the past 24 hour(s))  Pregnancy, urine POC     Status: Abnormal   Collection Time: 02/09/20 10:51 AM  Result Value Ref Range   Preg Test, Ur POSITIVE (A) NEGATIVE  Urinalysis, Routine w reflex microscopic Urine, Clean Catch     Status: Abnormal   Collection Time: 02/09/20 12:54 PM  Result Value Ref Range   Color, Urine YELLOW YELLOW   APPearance HAZY (A) CLEAR   Specific Gravity, Urine 1.016 1.005 - 1.030   pH 7.0 5.0 - 8.0    Glucose, UA NEGATIVE NEGATIVE mg/dL   Hgb urine dipstick LARGE (A) NEGATIVE   Bilirubin Urine NEGATIVE NEGATIVE   Ketones, ur NEGATIVE NEGATIVE mg/dL   Protein, ur NEGATIVE NEGATIVE mg/dL   Nitrite NEGATIVE NEGATIVE   Leukocytes,Ua NEGATIVE NEGATIVE   RBC / HPF >50 (H) 0 - 5 RBC/hpf   WBC, UA 6-10 0 - 5 WBC/hpf   Bacteria, UA NONE SEEN NONE SEEN  CBC     Status: Abnormal   Collection Time: 02/09/20  1:11 PM  Result Value Ref Range   WBC 10.6 (H) 4.0 - 10.5 K/uL   RBC 5.13 (H)  3.87 - 5.11 MIL/uL   Hemoglobin 14.7 12.0 - 15.0 g/dL   HCT 16.1 09.6 - 04.5 %   MCV 87.3 80.0 - 100.0 fL   MCH 28.7 26.0 - 34.0 pg   MCHC 32.8 30.0 - 36.0 g/dL   RDW 40.9 81.1 - 91.4 %   Platelets 362 150 - 400 K/uL   nRBC 0.0 0.0 - 0.2 %  hCG, quantitative, pregnancy     Status: Abnormal   Collection Time: 02/09/20  1:11 PM  Result Value Ref Range   hCG, Beta Chain, Quant, S 1,940 (H) <5 mIU/mL       IMAGING US OB LESS THAN 14 WEEKS WITH OB TRANSVAGINAL  Result Date: 02/09/2020 CLINICAL DATA:  Ava vaginal bleeding. Last menstrual period 11/24/2019 EXAM: OBSTETRIC <14 WK [place] AND TRANSVAGINAL OB US TECHNIQUE: Both transabdominal and transvaginal ultrasound examinations were performed for complete evaluation of the gestation as well as the maternal uterus, adnexal regions, and pelvic cul-de-sac. Transvaginal technique was performed to assess early pregnancy. COMPARISON:  None. FINDINGS: Intrauterine gestational sac: None Yolk sac:  Not Visualized. Embryo:  Not Visualized. Maternal uterus/adnexae: Normal appearance of the ovaries. Retroflexed uterus with heterogeneous hypervascular thickened endometrial measuring 2.5 cm. A hypervascular hypoattenuated area within the endometrium in the posterior fundus stents out and may represent retained products of conception. IMPRESSION: No intrauterine or extrauterine pregnancy identified. A hypervascular hypoattenuated area within thickened heterogeneous endometrium may  represent retained products of conception. Clinical correlation is advised. Electronically Signed   By: Ted Mcalpine M.D.   On: 02/09/2020 14:24    MAU Management/MDM: Orders Placed This Encounter  Procedures  . Wet prep, genital  . US OB LESS THAN 14 WEEKS WITH OB TRANSVAGINAL  . Urinalysis, Routine w reflex microscopic Urine, Clean Catch  . CBC  . hCG, quantitative, pregnancy  . Pregnancy, urine POC  . Discharge patient    Meds ordered this encounter  Medications  . lactated ringers bolus 1,000 mL  . ibuprofen (ADVIL) 600 MG tablet    Sig: Take 1 tablet (600 mg total) by mouth every 6 (six) hours as needed.    Dispense:  30 tablet    Refill:  1    Order Specific Question:   Supervising Provider    Answer:   Alysia Penna, MICHAEL L [[id number]]    Pt with heavy bleeding upon arrival, unable to remove clot from cervix on initial exam but exam repeated at pt request after [place] and likely POCs removed and sent to pathology. Bleeding minimal for 30 minutes after exam and pt desires discharge. Offered ANORA genetic testing but pt declined. Pt to f/u with Dr Henderson Cloud this week. Note provided for pt to be out of work x 3 days, return 02/12/20. Bleeding precautions reviewed.   Pt discharged with strict return precautions.  ASSESSMENT 1. SAB (spontaneous abortion)   2. Vaginal bleeding in pregnancy, first trimester     PLAN Discharge home  Allergies as of 02/09/2020   No Known Allergies     Medication List    TAKE these medications   ARMOUR THYROID PO Take 105 mg by mouth daily.   calcium carbonate 1500 (600 Ca) MG Tabs tablet Commonly known as: OSCAL Take by mouth 2 (two) times daily with a meal.   escitalopram 10 MG tablet Commonly known as: LEXAPRO Take 10 mg by mouth daily.   ibuprofen 600 MG tablet Commonly known as: ADVIL Take 1 tablet (600 mg total) by mouth every 6 (six) hours as  needed.   multivitamin with minerals tablet Take 1 tablet by mouth daily.   omeprazole 40 MG  capsule Commonly known as: PRILOSEC Take 40 mg by mouth 2 (two) times daily.     ASK your doctor about these medications   amoxicillin 875 MG tablet Commonly known as: AMOXIL Take 1 twice a day X 10 days.   fluticasone 50 MCG/ACT nasal spray Commonly known as: FLONASE 1 or 2 sprays each nostril twice a day   levothyroxine 137 MCG tablet Commonly known as: SYNTHROID Take 1 tablet (137 mcg total) by mouth daily.   ofloxacin 0.3 % ophthalmic solution Commonly known as: Ocuflox Place 1 drop into the right eye 4 (four) times daily.   Qvar RediHaler 80 MCG/ACT inhaler Generic drug: beclomethasone   sucralfate 1 g tablet Commonly known as: CARAFATE Take 1 g by mouth 4 (four) times daily -  with meals and at bedtime.   vitamin C 1000 MG tablet Take 1,000 mg by mouth daily.       Follow-up Information    Harold Hedge, MD. Schedule an appointment as soon as possible for a visit.   Specialty: Obstetrics and Gynecology Why: Return to MAU as needed for emergencies Contact information: [address] [place] [address] [place] [address] [phone]               Sharen Counter Certified Nurse-Midwife 02/09/2020  3:55 PM

## 2020-02-09 NOTE — MAU Note (Signed)
Presents with c/o VB and passing clots, reports "lots of cramping".  States VB began yesterday @ 1600.  States saturated pad this morning in 30 minutes. LMP 11/24/2019. +HPT.

## 2020-02-12 LAB — SURGICAL PATHOLOGY

## 2020-03-09 ENCOUNTER — Other Ambulatory Visit: Payer: Self-pay | Admitting: Obstetrics and Gynecology

## 2020-03-09 DIAGNOSIS — Z9189 Other specified personal risk factors, not elsewhere classified: Secondary | ICD-10-CM

## 2020-04-01 ENCOUNTER — Other Ambulatory Visit: Payer: Self-pay

## 2020-04-01 ENCOUNTER — Ambulatory Visit
Admission: RE | Admit: 2020-04-01 | Discharge: 2020-04-01 | Disposition: A | Discharge status: Home / Self Care | Payer: Managed Care, Other (non HMO) | Source: Ambulatory Visit | Attending: Obstetrics and Gynecology | Admitting: Obstetrics and Gynecology

## 2020-04-01 DIAGNOSIS — Z9189 Other specified personal risk factors, not elsewhere classified: Secondary | ICD-10-CM

## 2020-04-01 MED ORDER — GADOBUTROL 1 MMOL/ML IV SOLN
10.0000 mL | Freq: Once | INTRAVENOUS | Status: AC | PRN
  Administered 2020-04-01: 10 mL via INTRAVENOUS

## 2021-11-01 IMAGING — MR MR BREAST BILAT WO/W CM
8 of 12 series · 33 of 48 positions shown · IV contrast (Multihance)
Comparison: Prior mammograms. 07/18/2017 breast MR cannot be
retrieved for comparison.

CLINICAL DATA: 40-year-old female for screening breast MR. Ergi
lifetime risk for developing breast cancer.

LABS:  None performed today
EXAM:
BILATERAL BREAST MRI WITH AND WITHOUT CONTRAST
TECHNIQUE: Multiplanar, multisequence MR images of both breasts were obtained
prior to and following the intravenous administration of 10 ml of
Gadavist

[Series 3: t2_tirm_tra ipat (a-p) · axial · 3.0mm · 0.78mm/px · 1 of 60 slices shown]
[im 1/60]
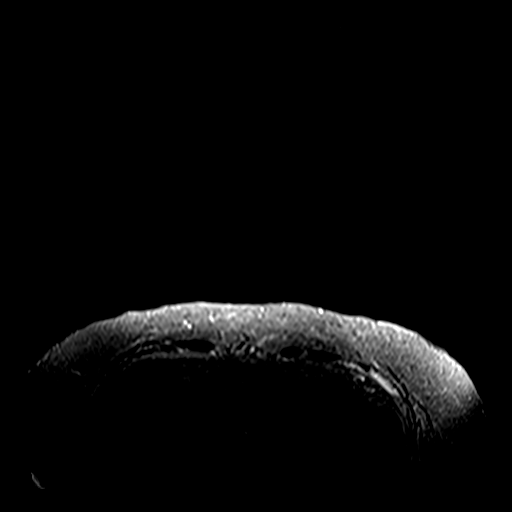

[Series 4: fl3d pre-cm no · axial · non-contrast · 1.2mm · 1.04mm/px · z∈[-68,+123]mm · 5 of 160 slices shown]
[im 1/160]
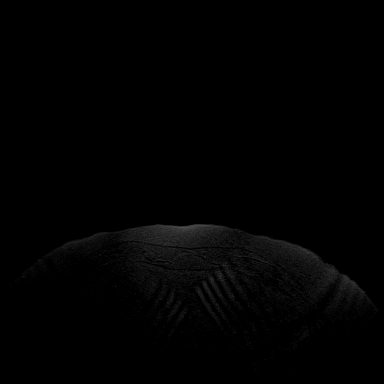
[im 40/160]
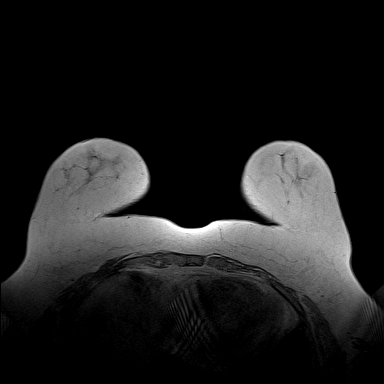
[im 80/160]
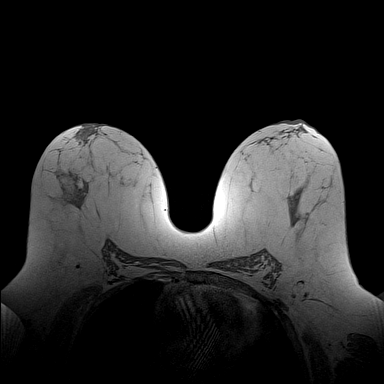
[im 120/160]
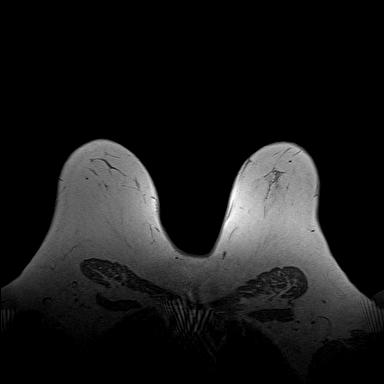
[im 160/160]
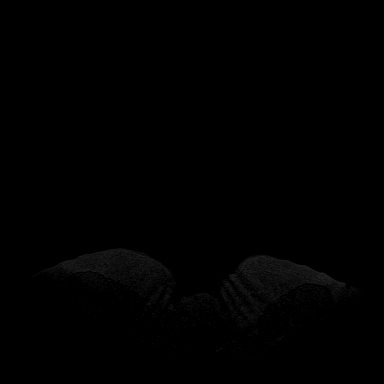

[Series 5: fl3d pre-cm · axial · non-contrast · 1.2mm · 1.04mm/px · z∈[-68,+123]mm · 5 of 160 slices shown]
[im 1/160]
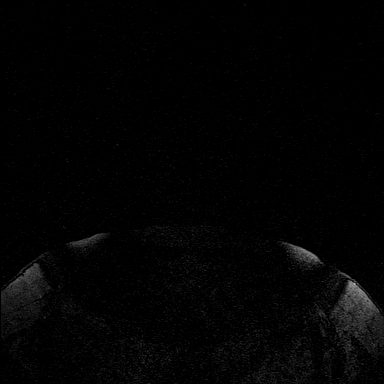
[im 40/160]
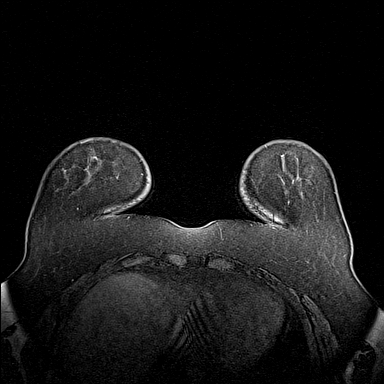
[im 80/160]
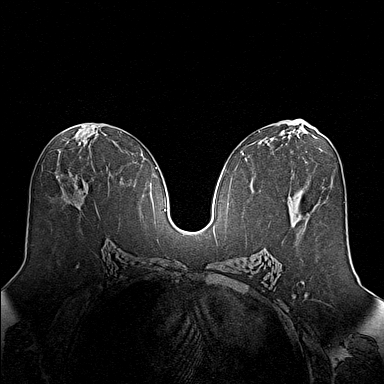
[im 120/160]
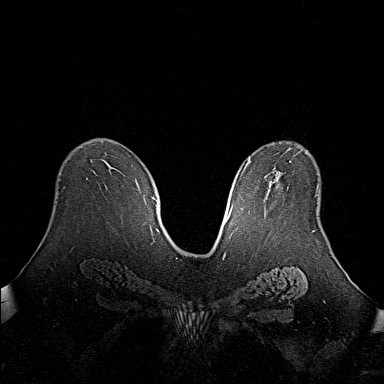
[im 160/160]
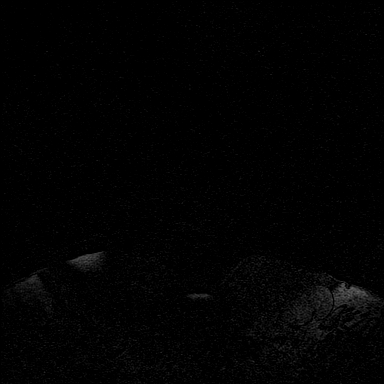

[Series 6: fl3d post-cm 20 · axial · 1.2mm · 1.04mm/px · z∈[-68,+123]mm · 5 of 160 slices shown (1 of 3)]
[im 1/160]
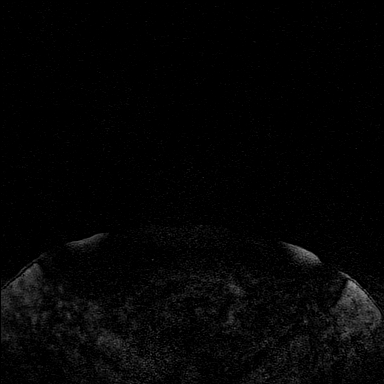
[im 40/160]
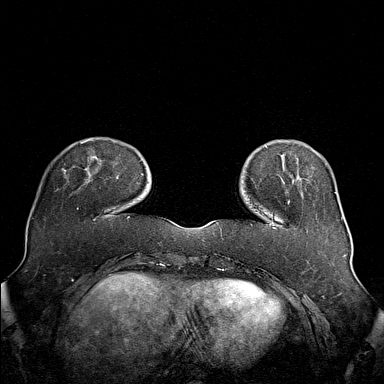
[im 80/160]
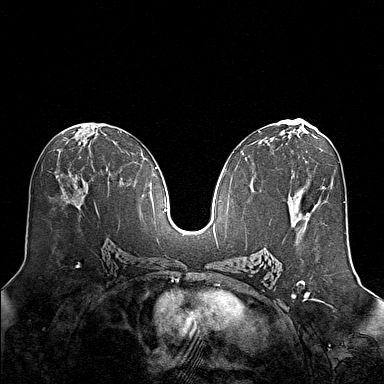
[im 120/160]
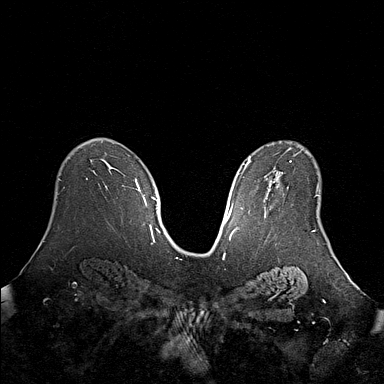
[im 160/160]
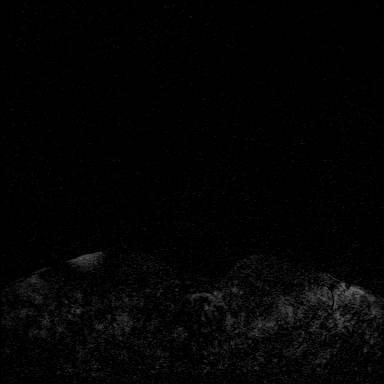

[Series 7: fl3d post-cm 20 · axial · 1.2mm · 1.04mm/px · z∈[-68,+123]mm · 5 of 160 slices shown (2 of 3)]
[im 1/160]
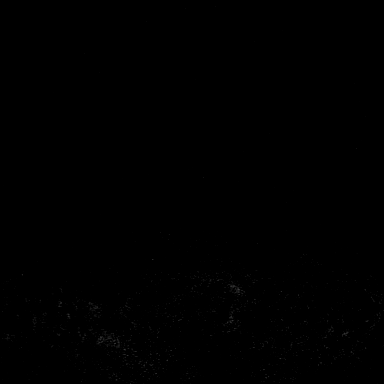
[im 40/160]
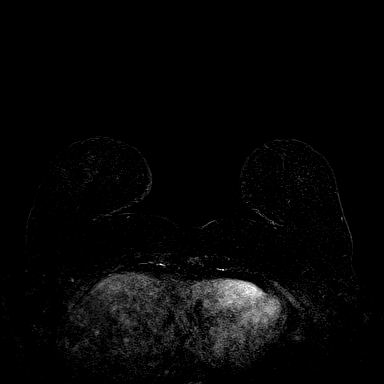
[im 80/160]
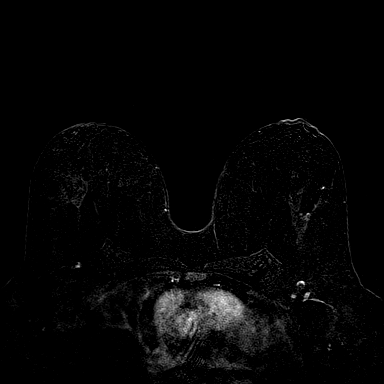
[im 120/160]
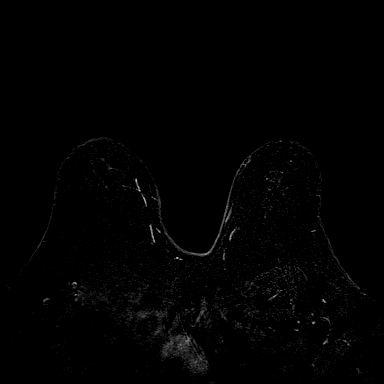
[im 160/160]
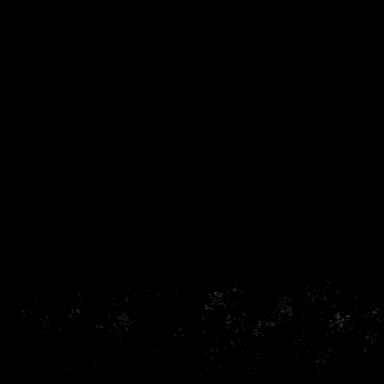

[Series 8: fl3d post-cm 20 · axial · 192.0mm · 1.04mm/px · 1 of 1 slices shown (3 of 3)]
[im 1/1]
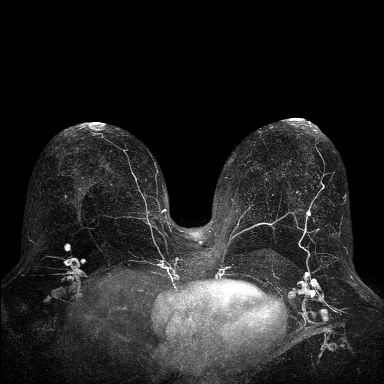

[Series 9: fl3d post-cm 3min · axial · 1.2mm · 1.04mm/px · z∈[-68,+123]mm · 6 of 160 slices shown]
[im 1/160]
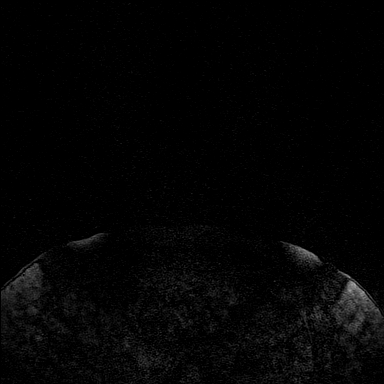
[im 32/160]
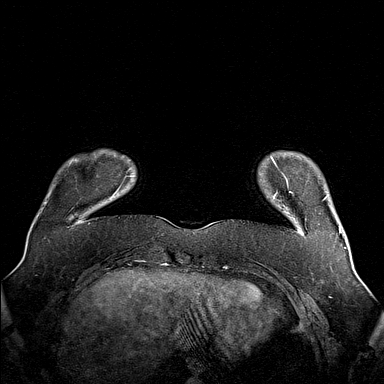
[im 64/160]
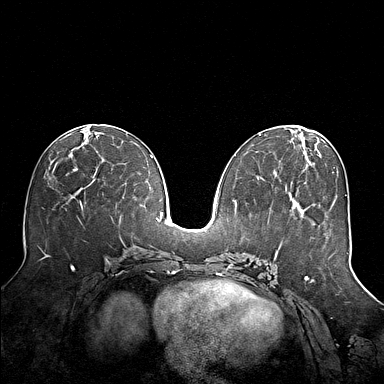
[im 96/160]
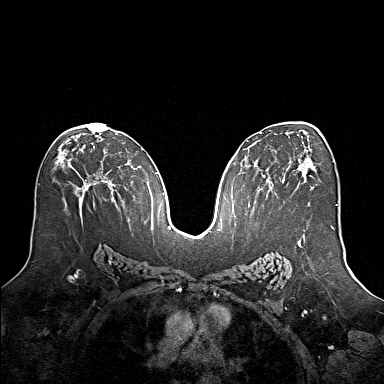
[im 128/160]
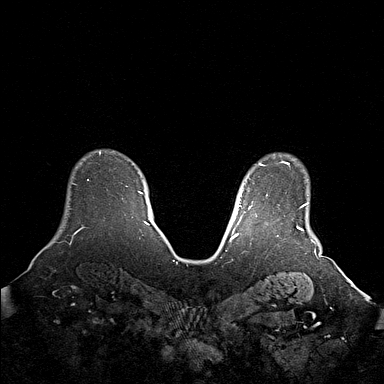
[im 160/160]
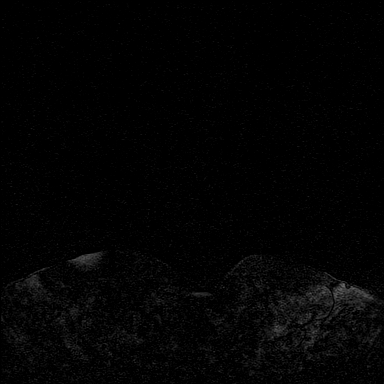

[Series 10: fl3d post-cm 3min_sub · axial · 1.2mm · 1.04mm/px · z∈[-68,+85]mm · 5 of 160 slices shown]
[im 1/160]
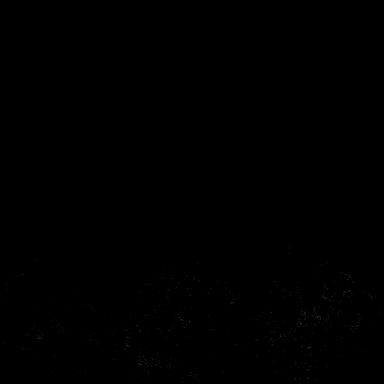
[im 32/160]
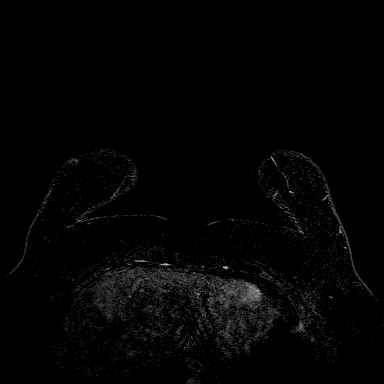
[im 64/160]
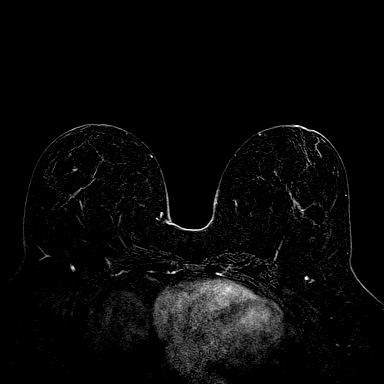
[im 96/160]
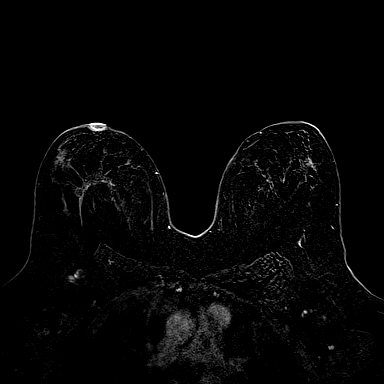
[im 128/160]
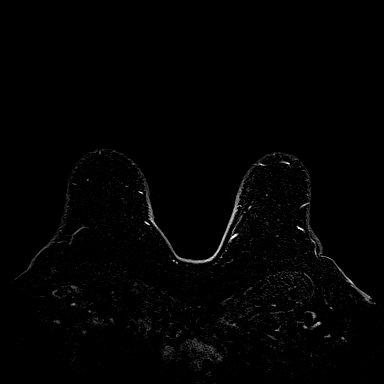

[33 of 48 positions shown; findings below may reference images not displayed]

Three-dimensional MR images were rendered by post-processing of the
original MR data on an independent workstation. The
three-dimensional MR images were interpreted, and findings are
reported in the following complete MRI report for this study. Three
dimensional images were evaluated at the independent interpreting
workstation using the DynaCAD thin client.
FINDINGS: Breast composition: b. Scattered fibroglandular tissue.

Background parenchymal enhancement: Mild

Right breast: No mass or abnormal enhancement.

Left breast: No mass or abnormal enhancement.

Lymph nodes: No abnormal appearing lymph nodes.

Ancillary findings:  None.
IMPRESSION: No MR evidence of breast malignancy.

RECOMMENDATION:
Bilateral screening mammogram in November 2020 to resume annual
mammogram schedule.

Bilateral screening breast MRI in 1 year in this high risk patient
as clinically indicated.

BI-RADS CATEGORY  1: Negative.

## 2023-05-03 LAB — OB RESULTS CONSOLE GC/CHLAMYDIA
Chlamydia: NEGATIVE
Neisseria Gonorrhea: NEGATIVE

## 2023-05-03 LAB — OB RESULTS CONSOLE RUBELLA ANTIBODY, IGM: Rubella: IMMUNE

## 2023-05-03 LAB — OB RESULTS CONSOLE HEPATITIS B SURFACE ANTIGEN: Hepatitis B Surface Ag: NEGATIVE

## 2023-05-03 LAB — OB RESULTS CONSOLE RPR: RPR: NONREACTIVE

## 2023-05-03 LAB — OB RESULTS CONSOLE HIV ANTIBODY (ROUTINE TESTING): HIV: NONREACTIVE

## 2023-05-11 ENCOUNTER — Other Ambulatory Visit: Payer: Self-pay | Admitting: Obstetrics and Gynecology

## 2023-05-11 DIAGNOSIS — O0991 Supervision of high risk pregnancy, unspecified, first trimester: Secondary | ICD-10-CM

## 2023-05-24 ENCOUNTER — Other Ambulatory Visit: Payer: Self-pay

## 2023-05-24 ENCOUNTER — Ambulatory Visit: Payer: Self-pay | Admitting: Obstetrics

## 2023-05-24 ENCOUNTER — Ambulatory Visit: Attending: Obstetrics | Admitting: Obstetrics and Gynecology

## 2023-05-24 DIAGNOSIS — Z3A15 15 weeks gestation of pregnancy: Secondary | ICD-10-CM | POA: Diagnosis not present

## 2023-05-24 DIAGNOSIS — O09522 Supervision of elderly multigravida, second trimester: Secondary | ICD-10-CM

## 2023-05-24 DIAGNOSIS — O285 Abnormal chromosomal and genetic finding on antenatal screening of mother: Secondary | ICD-10-CM | POA: Diagnosis not present

## 2023-05-24 NOTE — Progress Notes (Signed)
 Virtual Visit via Video Note  I connected with [name] on 05/24/23 at  9:00 AM EDT by a video enabled telemedicine application and verified that I am speaking with the correct person using two identifiers.  Location: Patient: Work ([place]) Provider: [facility]    Referring Provider:  Nestor Banter Length of Consultation: 25 minutes  [name] was referred to [facility] for genetic counseling because of advanced maternal age and an initial low fetal fraction on Panorama screening.  The patient will be 43 years old at the time of delivery.  This note summarizes the information we discussed.    We explained that the chance of a chromosome abnormality increases with maternal age.  Chromosomes and examples of chromosome problems were reviewed.  Humans typically have 46 chromosomes in each cell, with half passed through each sperm and egg.  Any change in the number or structure of chromosomes can increase the risk of problems in the physical and mental development of a pregnancy.   Based upon age of the patient and the current gestational age, the chance of any chromosome abnormality was 1 in 43. The chance of Down syndrome, the most common chromosome problem associated with maternal age, was 1 in 35.  The risk of chromosome problems is in addition to the 3% general population risk for birth defects and intellectual disabilities.  The greatest chance, of course, is that the baby would be born in good health.  Panorama Cell free fetal DNA testing:  We reviewed that cfDNA screening analyzes cell-free DNA originating from the placenta that is found in the maternal blood circulation during pregnancy. This test can provide information regarding the presence or absence of extra fetal (placental) DNA for chromosomes 13, 18, and 21 as well as the sex chromosomes. [name] first sample for cfDNA testing showed high risk due to a low fetal fraction (2.3%). Low fetal  fraction has been associated with early gestational age, high maternal weight, sub-optimal sample collection, pregnancy loss, low molecular weight heparin, and certain chromosomal abnormalities. Given that there was a low fetal fraction in [name]'s blood sample the laboratory was unable to obtain a reliable result using standard cfDNA screening methods, therefore, they performed an additional analysis incorporating fetal fraction, maternal age, maternal weight, and gestational age. Based upon the results of the additional analysis, the current pregnancy is at high risk for Triploidy, Trisomy 80, or Trisomy 28.   A repeat sample was sent by her OB which showed an appropriate fetal fraction (now 3.0%), which did allow for analysis of the sample and gave negative results.  The results from the second sample were low risk for Trisomy 13, 18, 21, monosomy X, triploidy and 22q deletion syndrome.  The fetal gender is predicted to be female. We reviewed that while there results are very encouraging, this screening test cannot assess the chance for all chromosome conditions and cannot detect all babies with chromosome differences.  See that report for residual risk information.  The following testing options were also made available: Maternal serum AFP screening, a blood test that measures pregnancy proteins, can provide risk assessment open neural tube defects (spina bifida, anencephaly). Because it does not directly examine the fetus, it cannot positively diagnose or rule out these problems. The detection rate is approximately 80% for open neural tube defects. This screening is typically ordered through the OB at 16-[redacted] weeks gestation.  Targeted ultrasound uses high frequency sound waves to create an image of  the developing fetus.  An ultrasound is often recommended as a routine means of evaluating the pregnancy.  It is also used to screen for fetal anatomy problems (for example, a heart defect) that might be  suggestive of a chromosomal or other abnormality.   Amniocentesis involves the removal of a small amount of amniotic fluid from the sac surrounding the fetus with the use of a thin needle inserted through the maternal abdomen and uterus.  Ultrasound guidance is used throughout the procedure.  Fetal cells from amniotic fluid are directly evaluated and > 99.5% of chromosome problems and > 98% of open neural tube defects can be detected. This procedure is generally performed after the 15th week of pregnancy.  The main risks to this procedure include complications leading to miscarriage in less than 1 in 500 cases.  Carrier screening. Per the ACOG Committee Opinion 691, all women who are considering a pregnancy or are currently pregnant should be offered carrier screening for, at minimum, Cystic Fibrosis (CF), Spinal Muscular Atrophy (SMA), and Hemoglobinopathies The mode of inheritance, clinical manifestations of these conditions, as well as details about testing were reviewed. A negative result on carrier screening reduces the likelihood of being a carrier, however, does not entirely rule out the possibility. [name] had prior carrier screening through the [employer] which includes the above mentioned conditions as well as 10 other genetic conditions.  Her results were negative, indicating that no changes were seen in these genes.  See that report for results and residual risk estimates.  Family and Pregnancy History: We obtained a detailed family history and pregnancy history.  This is the fourth pregnancy for [name]. She has a healthy 76 year old son from a prior relationship.  She and her current partner, [name], had two first trimester miscarriages prior to this pregnancy.  [name] has two healthy daughters, ages 10 and 64 years. In this pregnancy, she reported light spotting after intercourse but otherwise no complications and no exposure to alcohol, tobacco or recreational drugs.  She is taking prescribed  medications which include lexapro, levothyroxine  and omeprazole. In the family history, there are multiple family members with hypertension, diabetes and heart disease, which we reviewed are likely due to a combination of genetic and environmental or lifestyle factors. There were also family members on both sides of [name]'s family with breast cancer, which she has previously spoken with her provider about and had negative genetic testing (per her report).  This was 8 or more years ago, so we suggested that she can speak with a cancer genetic counselor if desired in case any testing options have changed.  In addition, [name] has a family history of pancreatic cancer in his father and grandfather.  This can also have strong genetic factors in some families. We are happy to connect these families with a cancer genetic counselor if desired. The remainder of the family history is unremarkable for birth defects, developmental delays, recurrent pregnancy loss or known chromosome abnormalities.  [name] was encouraged to call with questions or concerns.  We can be contacted at [phone].  Plan of Care: Scheduled for fetal anatomy ultrasound on June 28, 2023 at [facility]. msAFP only to be drawn by OB if desired for spina bifida screening [name] declined amniocentesis at this time, and stated that she is comfortable with the low risk cell free results.   Purnell Bruch, MS, CGC  In total, 50 minutes were spent on the date of the encounter in service  to the patient including preparation, record review, virtual video consultation, documentation and care coordination.  [name]

## 2023-06-16 DIAGNOSIS — O09529 Supervision of elderly multigravida, unspecified trimester: Secondary | ICD-10-CM | POA: Insufficient documentation

## 2023-06-16 DIAGNOSIS — O280 Abnormal hematological finding on antenatal screening of mother: Secondary | ICD-10-CM | POA: Insufficient documentation

## 2023-06-16 DIAGNOSIS — O99210 Obesity complicating pregnancy, unspecified trimester: Secondary | ICD-10-CM | POA: Insufficient documentation

## 2023-06-16 DIAGNOSIS — E039 Hypothyroidism, unspecified: Secondary | ICD-10-CM | POA: Insufficient documentation

## 2023-06-21 ENCOUNTER — Other Ambulatory Visit

## 2023-06-28 ENCOUNTER — Other Ambulatory Visit: Payer: Self-pay | Admitting: *Deleted

## 2023-06-28 ENCOUNTER — Ambulatory Visit

## 2023-06-28 ENCOUNTER — Ambulatory Visit: Attending: Obstetrics and Gynecology | Admitting: Obstetrics and Gynecology

## 2023-06-28 VITALS — BP 127/56 | HR 67

## 2023-06-28 DIAGNOSIS — Z363 Encounter for antenatal screening for malformations: Secondary | ICD-10-CM | POA: Diagnosis present

## 2023-06-28 DIAGNOSIS — O99891 Other specified diseases and conditions complicating pregnancy: Secondary | ICD-10-CM | POA: Diagnosis not present

## 2023-06-28 DIAGNOSIS — E039 Hypothyroidism, unspecified: Secondary | ICD-10-CM

## 2023-06-28 DIAGNOSIS — E669 Obesity, unspecified: Secondary | ICD-10-CM | POA: Diagnosis not present

## 2023-06-28 DIAGNOSIS — R7303 Prediabetes: Secondary | ICD-10-CM | POA: Diagnosis not present

## 2023-06-28 DIAGNOSIS — O99212 Obesity complicating pregnancy, second trimester: Secondary | ICD-10-CM | POA: Diagnosis not present

## 2023-06-28 DIAGNOSIS — O0991 Supervision of high risk pregnancy, unspecified, first trimester: Secondary | ICD-10-CM

## 2023-06-28 DIAGNOSIS — O09522 Supervision of elderly multigravida, second trimester: Secondary | ICD-10-CM

## 2023-06-28 DIAGNOSIS — O99810 Abnormal glucose complicating pregnancy: Secondary | ICD-10-CM | POA: Diagnosis not present

## 2023-06-28 DIAGNOSIS — O280 Abnormal hematological finding on antenatal screening of mother: Secondary | ICD-10-CM

## 2023-06-28 DIAGNOSIS — Z3A20 20 weeks gestation of pregnancy: Secondary | ICD-10-CM | POA: Diagnosis not present

## 2023-06-28 DIAGNOSIS — O99282 Endocrine, nutritional and metabolic diseases complicating pregnancy, second trimester: Secondary | ICD-10-CM

## 2023-06-28 DIAGNOSIS — O99210 Obesity complicating pregnancy, unspecified trimester: Secondary | ICD-10-CM

## 2023-06-28 DIAGNOSIS — O358XX0 Maternal care for other (suspected) fetal abnormality and damage, not applicable or unspecified: Secondary | ICD-10-CM

## 2023-06-28 DIAGNOSIS — Z3689 Encounter for other specified antenatal screening: Secondary | ICD-10-CM

## 2023-06-28 NOTE — Progress Notes (Signed)
 After review, MFM consult with provider is not indicated for today  Arna Ranks, MD 06/28/2023 1:57 PM  [facility]

## 2023-07-28 ENCOUNTER — Ambulatory Visit

## 2023-07-28 ENCOUNTER — Ambulatory Visit: Attending: Obstetrics and Gynecology | Admitting: Maternal & Fetal Medicine

## 2023-07-28 VITALS — BP 133/57 | HR 79

## 2023-07-28 DIAGNOSIS — O99212 Obesity complicating pregnancy, second trimester: Secondary | ICD-10-CM | POA: Diagnosis not present

## 2023-07-28 DIAGNOSIS — E669 Obesity, unspecified: Secondary | ICD-10-CM

## 2023-07-28 DIAGNOSIS — Z79890 Hormone replacement therapy (postmenopausal): Secondary | ICD-10-CM | POA: Diagnosis not present

## 2023-07-28 DIAGNOSIS — Z3A24 24 weeks gestation of pregnancy: Secondary | ICD-10-CM | POA: Insufficient documentation

## 2023-07-28 DIAGNOSIS — O99810 Abnormal glucose complicating pregnancy: Secondary | ICD-10-CM | POA: Insufficient documentation

## 2023-07-28 DIAGNOSIS — O99283 Endocrine, nutritional and metabolic diseases complicating pregnancy, third trimester: Secondary | ICD-10-CM | POA: Diagnosis not present

## 2023-07-28 DIAGNOSIS — E039 Hypothyroidism, unspecified: Secondary | ICD-10-CM | POA: Diagnosis not present

## 2023-07-28 DIAGNOSIS — Z3689 Encounter for other specified antenatal screening: Secondary | ICD-10-CM | POA: Insufficient documentation

## 2023-07-28 DIAGNOSIS — O99210 Obesity complicating pregnancy, unspecified trimester: Secondary | ICD-10-CM

## 2023-07-28 DIAGNOSIS — O99282 Endocrine, nutritional and metabolic diseases complicating pregnancy, second trimester: Secondary | ICD-10-CM

## 2023-07-28 DIAGNOSIS — O358XX0 Maternal care for other (suspected) fetal abnormality and damage, not applicable or unspecified: Secondary | ICD-10-CM

## 2023-07-28 DIAGNOSIS — O09523 Supervision of elderly multigravida, third trimester: Secondary | ICD-10-CM

## 2023-07-28 DIAGNOSIS — O09522 Supervision of elderly multigravida, second trimester: Secondary | ICD-10-CM | POA: Insufficient documentation

## 2023-07-28 DIAGNOSIS — O280 Abnormal hematological finding on antenatal screening of mother: Secondary | ICD-10-CM

## 2023-07-28 DIAGNOSIS — O99280 Endocrine, nutritional and metabolic diseases complicating pregnancy, unspecified trimester: Secondary | ICD-10-CM

## 2023-07-28 NOTE — Progress Notes (Signed)
   Patient information  Patient Name: [name]  Patient MRN:   [id number]  Referring practice: MFM Referring Provider: [facility]  Problem List   Patient Active Problem List   Diagnosis Date Noted   Abnormal maternal serum screening test 06/16/2023   AMA (advanced maternal age) multigravida 35+ 06/16/2023   Obesity affecting pregnancy, antepartum 06/16/2023   Hypothyroidism affecting pregnancy 06/16/2023    Maternal Fetal medicine Consult  [name] is a 43 y.o. G5P1031 at [redacted]w[redacted]d here for ultrasound and consultation. [name] is doing well today with no acute concerns. Today we focused on the following:   AMA: Growth is stable today. Continue growth US  and antenatal testing around 34 weeks.   Hypothyroidism: TSH on 7/18 was at goal. Synthroid  is at 150 mcg. Continue to dose adjust with goal of TSH < 2.5.   The patient had time to ask questions that were answered to her satisfaction.  She verbalized understanding and agrees to proceed with the plan below.  Sonographic findings Single intrauterine pregnancy at 24w 3d.  Fetal cardiac activity:  Observed and appears normal. Presentation: Cephalic. Interval fetal anatomy appears normal. Fetal biometry shows the estimated fetal weight at the 58 percentile. Amniotic fluid volume: Within normal limits. MVP: 5.32 cm. Placenta: Posterior.  There are limitations of prenatal ultrasound such as the inability to detect certain abnormalities due to poor visualization. Various factors such as fetal position, gestational age and maternal body habitus may increase the difficulty in visualizing the fetal anatomy.    Recommendations -Serial growth ultrasounds every 4-6 weeks until delivery -Antenatal testing to start around 32-34 weeks  **All future US  to be done at the referring providers office. Please refer back to MFM if future US  are desired.   Review of Systems: A review of systems was performed and was negative  except per HPI   Vitals and Physical Exam    07/28/2023    8:59 AM 06/28/2023    9:58 AM 02/09/2020    3:57 PM  Vitals with BMI  Systolic 133 127 868  Diastolic 57 56 76  Pulse 79 67 77    Sitting comfortably on the sonogram table Nonlabored breathing Normal rate and rhythm Abdomen is nontender  Past pregnancies OB History  Gravida Para Term Preterm AB Living  5 1 1  3 1   SAB IAB Ectopic Multiple Live Births  2 1  0 1    # Outcome Date GA Lbr Len/2nd Weight Sex Type Anes PTL Lv  5 Current           4 Term 03/21/15 [redacted]w[redacted]d 02:19 / 01:11 6 lb 9.3 oz (2.985 kg) M Vag-Spont Local  LIV     Birth Comments: wnl  3 IAB 2004          2 SAB           1 SAB              I spent 20 minutes reviewing the patients chart, including labs and images as well as counseling the patient about her medical conditions. Greater than 50% of the time was spent in direct face-to-face patient counseling.  Delora Smaller  MFM, [facility]   07/28/2023  10:03 AM

## 2023-07-28 NOTE — Progress Notes (Unsigned)
 Na

## 2023-10-19 ENCOUNTER — Encounter (HOSPITAL_COMMUNITY): Payer: Self-pay | Admitting: Obstetrics and Gynecology

## 2023-10-19 ENCOUNTER — Inpatient Hospital Stay (HOSPITAL_COMMUNITY)
Admission: AD | Admit: 2023-10-19 | Discharge: 2023-10-19 | Disposition: A | Discharge status: Home / Self Care | Source: Home / Self Care | Attending: Obstetrics and Gynecology | Admitting: Obstetrics and Gynecology

## 2023-10-19 DIAGNOSIS — Z8249 Family history of ischemic heart disease and other diseases of the circulatory system: Secondary | ICD-10-CM | POA: Insufficient documentation

## 2023-10-19 DIAGNOSIS — O133 Gestational [pregnancy-induced] hypertension without significant proteinuria, third trimester: Secondary | ICD-10-CM | POA: Diagnosis not present

## 2023-10-19 DIAGNOSIS — Z3A36 36 weeks gestation of pregnancy: Secondary | ICD-10-CM

## 2023-10-19 DIAGNOSIS — O42913 Preterm premature rupture of membranes, unspecified as to length of time between rupture and onset of labor, third trimester: Secondary | ICD-10-CM | POA: Diagnosis not present

## 2023-10-19 LAB — CBC
HCT: 38.2 % (ref 36.0–46.0)
Hemoglobin: 13 g/dL (ref 12.0–15.0)
MCH: 28.8 pg (ref 26.0–34.0)
MCHC: 34 g/dL (ref 30.0–36.0)
MCV: 84.7 fL (ref 80.0–100.0)
Platelets: 246 K/uL (ref 150–400)
RBC: 4.51 MIL/uL (ref 3.87–5.11)
RDW: 13.5 % (ref 11.5–15.5)
WBC: 8.9 K/uL (ref 4.0–10.5)
nRBC: 0 % (ref 0.0–0.2)

## 2023-10-19 LAB — COMPREHENSIVE METABOLIC PANEL WITH GFR
ALT: 13 U/L (ref 0–44)
AST: 16 U/L (ref 15–41)
Albumin: 2.4 g/dL — ABNORMAL LOW (ref 3.5–5.0)
Alkaline Phosphatase: 114 U/L (ref 38–126)
Anion gap: 10 (ref 5–15)
BUN: 5 mg/dL — ABNORMAL LOW (ref 6–20)
CO2: 20 mmol/L — ABNORMAL LOW (ref 22–32)
Calcium: 8.5 mg/dL — ABNORMAL LOW (ref 8.9–10.3)
Chloride: 104 mmol/L (ref 98–111)
Creatinine, Ser: 0.61 mg/dL (ref 0.44–1.00)
GFR, Estimated: >60 mL/min (ref >60)
Glucose, Bld: 113 mg/dL — ABNORMAL HIGH (ref 70–99)
Potassium: 4.2 mmol/L (ref 3.5–5.1)
Sodium: 134 mmol/L — ABNORMAL LOW (ref 135–145)
Total Bilirubin: 0.3 mg/dL (ref 0.0–1.2)
Total Protein: 5.9 g/dL — ABNORMAL LOW (ref 6.5–8.1)

## 2023-10-19 LAB — PROTEIN / CREATININE RATIO, URINE
Creatinine, Urine: 67 mg/dL
Protein Creatinine Ratio: 0.16 mg/mg{creat} — ABNORMAL HIGH (ref 0.00–0.15)
Total Protein, Urine: 11 mg/dL

## 2023-10-19 NOTE — MAU Note (Signed)
 RONETTE HANK is a 43 y.o. at [redacted]w[redacted]d here in MAU reporting: she was in office for a routine visit and her b/p was a little elevated so she was sent over for evaluation. Denies headache, blurred vision, or RUQ pain. Reports positive fetal movement.   Onset of complaint: today Pain score: 0/10 There were no vitals filed for this visit.   FHT: 150  Lab orders placed from triage: none

## 2023-10-19 NOTE — MAU Provider Note (Signed)
 History     CSN: [id number]  Arrival date and time: 10/19/23 1107   None     Chief Complaint  Patient presents with   Hypertension   HPI Patient is a 43 year old G5 P1-0-3-1 at 36 weeks 2 days presenting for elevated blood pressures in the clinic.  She was sent here for evaluation because of her elevated BPs at her routine OB visit.  Denies any headaches, change in vision, right upper quadrant pain.  Reports that she is feeling good fetal movement.  Denies any vaginal bleeding or leaking of fluid.   OB History     Gravida  5   Para  1   Term  1   Preterm      AB  3   Living  1      SAB  2   IAB  1   Ectopic      Multiple  0   Live Births  1           Past Medical History:  Diagnosis Date   EE (eosinophilic esophagitis) 2019   Headache    Hx of pyelonephritis    x1 in high school   Hx of varicella    Hypothyroidism    Infected sebaceous cyst 10/13/2015    Past Surgical History:  Procedure Laterality Date   ESOPHAGOGASTRODUODENOSCOPY (EGD) WITH PROPOFOL  N/A 08/09/2017   Procedure: ESOPHAGOGASTRODUODENOSCOPY (EGD) WITH PROPOFOL ;  Surgeon: Viktoria Lamar DASEN, MD;  Location: [facility] ENDOSCOPY;  Service: Endoscopy;  Laterality: N/A;   SHOULDER SURGERY  2014   R shoulder    Family History  Problem Relation Age of Onset   Diabetes Paternal Grandmother    Cancer Paternal Grandmother        breast   Asthma Paternal Grandmother    Heart attack Mother    Thyroid disease Mother    Heart attack Brother    Thyroid disease Maternal Aunt    Breast cancer Maternal Aunt        late 57's early 50's   Thyroid disease Paternal Aunt    Breast cancer Paternal Aunt        x3 22, late 29's, late 60's   Thyroid disease Paternal Uncle    Heart disease Maternal Grandmother    Diabetes Maternal Grandmother     Social History   Tobacco Use   Smoking status: Never   Smokeless tobacco: Never  Vaping Use   Vaping status: Never Used  Substance Use Topics    Alcohol use: Not Currently   Drug use: No    Allergies: No Known Allergies  Medications Prior to Admission  Medication Sig Dispense Refill Last Dose/Taking   aspirin EC 81 MG tablet Take 81 mg by mouth daily. Swallow whole.   10/18/2023   escitalopram (LEXAPRO) 10 MG tablet Take 10 mg by mouth daily.   10/18/2023   levothyroxine  (SYNTHROID , LEVOTHROID) 137 MCG tablet Take 1 tablet (137 mcg total) by mouth daily. 90 tablet 1 10/19/2023   omeprazole (PRILOSEC) 40 MG capsule Take 40 mg by mouth 2 (two) times daily.   10/18/2023   Prenatal Vit-Fe Fumarate-FA (MULTIVITAMIN-PRENATAL) 27-0.8 MG TABS tablet Take 1 tablet by mouth daily at 12 noon.   10/18/2023   amoxicillin  (AMOXIL ) 875 MG tablet Take 1 twice a day X 10 days. 20 tablet 0    ARMOUR THYROID PO Take 105 mg by mouth daily.      Ascorbic Acid (VITAMIN C) 1000 MG tablet Take 1,000 mg by mouth  daily.      calcium carbonate (OSCAL) 1500 (600 Ca) MG TABS tablet Take by mouth 2 (two) times daily with a meal.      fluticasone  (FLONASE ) 50 MCG/ACT nasal spray 1 or 2 sprays each nostril twice a day (Patient taking differently: 1 or 2 sprays each nostril twice a day) 16 g 0    ibuprofen  (ADVIL ) 600 MG tablet Take 1 tablet (600 mg total) by mouth every 6 (six) hours as needed. 30 tablet 1    Multiple Vitamins-Minerals (MULTIVITAMIN WITH MINERALS) tablet Take 1 tablet by mouth daily.      ofloxacin  (OCUFLOX ) 0.3 % ophthalmic solution Place 1 drop into the right eye 4 (four) times daily. 5 mL 0    QVAR REDIHALER 80 MCG/ACT inhaler       sucralfate (CARAFATE) 1 g tablet Take 1 g by mouth 4 (four) times daily -  with meals and at bedtime.       Review of Systems  Eyes:  Negative for visual disturbance.  Gastrointestinal:  Negative for abdominal pain.  Neurological:  Negative for headaches.   Physical Exam   Blood pressure 135/74, pulse 77, temperature 98.6 F (37 C), temperature source Oral, resp. rate 17, height 5' 3 (1.6 m), weight 121.5  kg, last menstrual period 02/07/2023, SpO2 98%, unknown if currently breastfeeding.  Physical Exam Vitals and nursing note reviewed.  Constitutional:      Appearance: Normal appearance.  HENT:     Head: Normocephalic and atraumatic.     Nose: No congestion or rhinorrhea.  Eyes:     Extraocular Movements: Extraocular movements intact.  Cardiovascular:     Rate and Rhythm: Normal rate.  Pulmonary:     Effort: Pulmonary effort is normal.  Abdominal:     Palpations: Abdomen is soft.     Tenderness: There is no abdominal tenderness.  Musculoskeletal:        General: Normal range of motion.     Cervical back: Normal range of motion.  Skin:    General: Skin is warm.     Capillary Refill: Capillary refill takes less than 2 seconds.  Neurological:     General: No focal deficit present.     Mental Status: She is alert.     Cranial Nerves: No cranial nerve deficit.  Psychiatric:        Mood and Affect: Mood normal.        Behavior: Behavior normal.    NST 135, moderate variability, accelerations, no decelerations, no contractions MAU Course  Procedures  MDM CBC CMP PC ratio NST  Assessment and Plan  [name] is a 43 year old G5 P1-0-3-1 at 36 weeks 2 days presenting for preeclampsia evaluation.  Elevated blood pressure affecting pregnancy Patient was in her primary OB office today for routine OB appointment and had blood pressures in the 150s/90s.  Was sent to the MAU for evaluation for preeclampsia.  Denies any symptoms of severe preeclampsia such as headache, change in vision, right upper quadrant pain, worsening swelling in lower extremities.  CBC within normal limits, CMP within normal limits.  PC ratio of 0.16.  Discussed with on-call provider who reports she now ruled in for gestational hypertension and needs a blood pressure check in her office tomorrow.  Instructed patient to call and schedule blood pressure check.  She also needs induction of labor at 37 weeks  which the provider will take care of.  Discussed tricked return precautions.  Patient discharged home.  Ayman Brull V  Melaina Howerton 10/19/2023, 11:57 AM

## 2023-10-19 NOTE — Discharge Instructions (Addendum)
 It was a pleasure taking care of you today.  Your blood pressures remained elevated while you were here and you have ruled in for gestational hypertension.  Your lab work did not show that you have preeclampsia.  If you start having headache that does not go away with Tylenol , pain in your right upper quadrant where I pressed, changes in vision, return immediately.  Please call your clinic and schedulePatient has a a blood pressure check tomorrow.  I hope you have a wonderful rest of your day!

## 2023-10-21 ENCOUNTER — Inpatient Hospital Stay (HOSPITAL_COMMUNITY)
Admission: AD | Admit: 2023-10-21 | Discharge: 2023-10-25 | DRG: 788 | Disposition: A | Discharge status: Home / Self Care | Attending: Obstetrics and Gynecology | Admitting: Obstetrics and Gynecology

## 2023-10-21 ENCOUNTER — Encounter (HOSPITAL_COMMUNITY): Payer: Self-pay | Admitting: Obstetrics and Gynecology

## 2023-10-21 ENCOUNTER — Other Ambulatory Visit: Payer: Self-pay

## 2023-10-21 DIAGNOSIS — Z8249 Family history of ischemic heart disease and other diseases of the circulatory system: Secondary | ICD-10-CM

## 2023-10-21 DIAGNOSIS — O4290 Premature rupture of membranes, unspecified as to length of time between rupture and onset of labor, unspecified weeks of gestation: Principal | ICD-10-CM | POA: Diagnosis present

## 2023-10-21 DIAGNOSIS — O134 Gestational [pregnancy-induced] hypertension without significant proteinuria, complicating childbirth: Secondary | ICD-10-CM | POA: Diagnosis present

## 2023-10-21 DIAGNOSIS — O42913 Preterm premature rupture of membranes, unspecified as to length of time between rupture and onset of labor, third trimester: Principal | ICD-10-CM | POA: Diagnosis present

## 2023-10-21 DIAGNOSIS — O99214 Obesity complicating childbirth: Secondary | ICD-10-CM | POA: Diagnosis present

## 2023-10-21 DIAGNOSIS — E039 Hypothyroidism, unspecified: Secondary | ICD-10-CM | POA: Diagnosis present

## 2023-10-21 DIAGNOSIS — F419 Anxiety disorder, unspecified: Secondary | ICD-10-CM | POA: Diagnosis present

## 2023-10-21 DIAGNOSIS — O99284 Endocrine, nutritional and metabolic diseases complicating childbirth: Secondary | ICD-10-CM | POA: Diagnosis present

## 2023-10-21 DIAGNOSIS — O99344 Other mental disorders complicating childbirth: Secondary | ICD-10-CM | POA: Diagnosis present

## 2023-10-21 DIAGNOSIS — E66813 Obesity, class 3: Secondary | ICD-10-CM | POA: Diagnosis present

## 2023-10-21 DIAGNOSIS — O322XX0 Maternal care for transverse and oblique lie, not applicable or unspecified: Secondary | ICD-10-CM | POA: Diagnosis present

## 2023-10-21 DIAGNOSIS — O24425 Gestational diabetes mellitus in childbirth, controlled by oral hypoglycemic drugs: Secondary | ICD-10-CM | POA: Diagnosis present

## 2023-10-21 DIAGNOSIS — Z3A36 36 weeks gestation of pregnancy: Secondary | ICD-10-CM

## 2023-10-21 DIAGNOSIS — Z833 Family history of diabetes mellitus: Secondary | ICD-10-CM | POA: Diagnosis not present

## 2023-10-21 LAB — COMPREHENSIVE METABOLIC PANEL WITH GFR
ALT: 14 U/L (ref 0–44)
AST: 12 U/L — ABNORMAL LOW (ref 15–41)
Albumin: 2.7 g/dL — ABNORMAL LOW (ref 3.5–5.0)
Alkaline Phosphatase: 118 U/L (ref 38–126)
Anion gap: 12 (ref 5–15)
BUN: 5 mg/dL — ABNORMAL LOW (ref 6–20)
CO2: 19 mmol/L — ABNORMAL LOW (ref 22–32)
Calcium: 9.7 mg/dL (ref 8.9–10.3)
Chloride: 103 mmol/L (ref 98–111)
Creatinine, Ser: 0.6 mg/dL (ref 0.44–1.00)
GFR, Estimated: >60 mL/min (ref >60)
Glucose, Bld: 87 mg/dL (ref 70–99)
Potassium: 3.7 mmol/L (ref 3.5–5.1)
Sodium: 134 mmol/L — ABNORMAL LOW (ref 135–145)
Total Bilirubin: 0.6 mg/dL (ref 0.0–1.2)
Total Protein: 6.2 g/dL — ABNORMAL LOW (ref 6.5–8.1)

## 2023-10-21 LAB — CBC
HCT: 41 % (ref 36.0–46.0)
Hemoglobin: 14.1 g/dL (ref 12.0–15.0)
MCH: 29.3 pg (ref 26.0–34.0)
MCHC: 34.4 g/dL (ref 30.0–36.0)
MCV: 85.1 fL (ref 80.0–100.0)
Platelets: 269 K/uL (ref 150–400)
RBC: 4.82 MIL/uL (ref 3.87–5.11)
RDW: 13.5 % (ref 11.5–15.5)
WBC: 10.3 K/uL (ref 4.0–10.5)
nRBC: 0 % (ref 0.0–0.2)

## 2023-10-21 LAB — TYPE AND SCREEN
ABO/RH(D): A POS
Antibody Screen: NEGATIVE

## 2023-10-21 LAB — POCT FERN TEST: POCT Fern Test: POSITIVE

## 2023-10-21 MED ORDER — SODIUM CHLORIDE 0.9 % IV SOLN
5.0000 10*6.[IU] | Freq: Once | INTRAVENOUS | Status: AC
  Administered 2023-10-22: 5 10*6.[IU] via INTRAVENOUS
  Filled 2023-10-21: qty 5

## 2023-10-21 MED ORDER — FENTANYL CITRATE (PF) 100 MCG/2ML IJ SOLN: 50.0000 ug | INTRAMUSCULAR | Status: DC | PRN

## 2023-10-21 MED ORDER — LACTATED RINGERS IV SOLN: INTRAVENOUS | Status: DC

## 2023-10-21 MED ORDER — LACTATED RINGERS IV SOLN: 500.0000 mL | INTRAVENOUS | Status: DC | PRN

## 2023-10-21 MED ORDER — OXYCODONE-ACETAMINOPHEN 5-325 MG PO TABS: 2.0000 | ORAL_TABLET | ORAL | Status: DC | PRN

## 2023-10-21 MED ORDER — OXYTOCIN-SODIUM CHLORIDE 30-0.9 UT/500ML-% IV SOLN: 2.5000 [IU]/h | INTRAVENOUS | Status: DC

## 2023-10-21 MED ORDER — LIDOCAINE HCL (PF) 1 % IJ SOLN: 30.0000 mL | INTRAMUSCULAR | Status: DC | PRN

## 2023-10-21 MED ORDER — FLEET ENEMA RE ENEM: 1.0000 | ENEMA | RECTAL | Status: DC | PRN

## 2023-10-21 MED ORDER — SOD CITRATE-CITRIC ACID 500-334 MG/5ML PO SOLN
30.0000 mL | ORAL | Status: DC | PRN
  Filled 2023-10-21: qty 30

## 2023-10-21 MED ORDER — OXYTOCIN BOLUS FROM INFUSION: 333.0000 mL | Freq: Once | INTRAVENOUS | Status: DC

## 2023-10-21 MED ORDER — PENICILLIN G POT IN DEXTROSE 60000 UNIT/ML IV SOLN
3.0000 10*6.[IU] | INTRAVENOUS | Status: DC
  Administered 2023-10-22: 3 10*6.[IU] via INTRAVENOUS
  Filled 2023-10-21: qty 50

## 2023-10-21 MED ORDER — ONDANSETRON HCL 4 MG/2ML IJ SOLN: 4.0000 mg | Freq: Four times a day (QID) | INTRAMUSCULAR | Status: DC | PRN

## 2023-10-21 MED ORDER — ACETAMINOPHEN 325 MG PO TABS: 650.0000 mg | ORAL_TABLET | ORAL | Status: DC | PRN

## 2023-10-21 MED ORDER — OXYCODONE-ACETAMINOPHEN 5-325 MG PO TABS: 1.0000 | ORAL_TABLET | ORAL | Status: DC | PRN

## 2023-10-21 NOTE — MAU Note (Signed)
 MAU Triage Note: [name] is a 43 y.o. at [redacted]w[redacted]d here in MAU reporting: thinks her water broke around 2115 - started having a continuous flow of clear/brown watery fluid and has continued to leak. Also having intermittent lower abdominal cramping that started 15 minutes ago. Denies VB. Does report sporadic fetal movement since eating dinner around 1800 today.   Patient complaint: water broke  Pain Score: 2  Pain Location: Abdomen     Onset of complaint: today LMP: Patient's last menstrual period was 02/07/2023.  FHT:   Bypass triage, patient taken directly to room Lab orders placed from triage: MAU labor

## 2023-10-22 ENCOUNTER — Inpatient Hospital Stay (HOSPITAL_COMMUNITY): Admitting: Anesthesiology

## 2023-10-22 ENCOUNTER — Encounter (HOSPITAL_COMMUNITY): Payer: Self-pay | Admitting: Obstetrics and Gynecology

## 2023-10-22 ENCOUNTER — Encounter (HOSPITAL_COMMUNITY)
Admission: AD | Disposition: A | Discharge status: Home / Self Care | Payer: Self-pay | Source: Home / Self Care | Attending: Obstetrics and Gynecology

## 2023-10-22 LAB — RPR: RPR Ser Ql: NONREACTIVE

## 2023-10-22 LAB — GLUCOSE, CAPILLARY: Glucose-Capillary: 91 mg/dL (ref 70–99)

## 2023-10-22 SURGERY — Surgical Case
Anesthesia: Spinal

## 2023-10-22 MED ORDER — OXYTOCIN-SODIUM CHLORIDE 30-0.9 UT/500ML-% IV SOLN
INTRAVENOUS | Status: AC
  Filled 2023-10-22: qty 500

## 2023-10-22 MED ORDER — SIMETHICONE 80 MG PO CHEW: 80.0000 mg | CHEWABLE_TABLET | ORAL | Status: DC | PRN

## 2023-10-22 MED ORDER — STERILE WATER FOR IRRIGATION IR SOLN
Status: DC | PRN
  Administered 2023-10-22: 1000 mL

## 2023-10-22 MED ORDER — TRANEXAMIC ACID-NACL 1000-0.7 MG/100ML-% IV SOLN
INTRAVENOUS | Status: DC | PRN
Start: 2023-10-22 — End: 2023-10-22
  Administered 2023-10-22: 1000 mg via INTRAVENOUS

## 2023-10-22 MED ORDER — KETOROLAC TROMETHAMINE 30 MG/ML IJ SOLN
INTRAMUSCULAR | Status: AC
  Filled 2023-10-22: qty 1

## 2023-10-22 MED ORDER — COCONUT OIL OIL: 1.0000 | TOPICAL_OIL | Status: DC | PRN

## 2023-10-22 MED ORDER — OXYTOCIN-SODIUM CHLORIDE 30-0.9 UT/500ML-% IV SOLN
INTRAVENOUS | Status: DC | PRN
  Administered 2023-10-22: 300 mL via INTRAVENOUS

## 2023-10-22 MED ORDER — ESCITALOPRAM OXALATE 10 MG PO TABS
10.0000 mg | ORAL_TABLET | Freq: Every day | ORAL | Status: DC
  Administered 2023-10-22 – 2023-10-24 (×3): 10 mg via ORAL
  Filled 2023-10-22 (×3): qty 1

## 2023-10-22 MED ORDER — TERBUTALINE SULFATE 1 MG/ML IJ SOLN: 0.2500 mg | Freq: Once | INTRAMUSCULAR | Status: DC | PRN

## 2023-10-22 MED ORDER — LEVOTHYROXINE SODIUM 150 MCG PO TABS
150.0000 ug | ORAL_TABLET | Freq: Every day | ORAL | Status: DC
  Administered 2023-10-22 – 2023-10-25 (×4): 150 ug via ORAL
  Filled 2023-10-22: qty 2
  Filled 2023-10-22 (×2): qty 1
  Filled 2023-10-22: qty 2
  Filled 2023-10-22: qty 1
  Filled 2023-10-22: qty 2
  Filled 2023-10-22: qty 1

## 2023-10-22 MED ORDER — DROPERIDOL 2.5 MG/ML IJ SOLN: 0.6250 mg | Freq: Once | INTRAMUSCULAR | Status: DC | PRN

## 2023-10-22 MED ORDER — WITCH HAZEL-GLYCERIN EX PADS: 1.0000 | MEDICATED_PAD | CUTANEOUS | Status: DC | PRN

## 2023-10-22 MED ORDER — LEVOTHYROXINE SODIUM 137 MCG PO TABS: 137.0000 ug | ORAL_TABLET | Freq: Every day | ORAL | Status: DC

## 2023-10-22 MED ORDER — SOD CITRATE-CITRIC ACID 500-334 MG/5ML PO SOLN: 30.0000 mL | ORAL | Status: DC

## 2023-10-22 MED ORDER — ONDANSETRON HCL 4 MG/2ML IJ SOLN
INTRAMUSCULAR | Status: AC
  Filled 2023-10-22: qty 2

## 2023-10-22 MED ORDER — ONDANSETRON HCL 4 MG/2ML IJ SOLN
INTRAMUSCULAR | Status: DC | PRN
  Administered 2023-10-22: 4 mg via INTRAVENOUS

## 2023-10-22 MED ORDER — SODIUM CHLORIDE 0.9 % IR SOLN
Status: DC | PRN
Start: 2023-10-22 — End: 2023-10-22
  Administered 2023-10-22 (×4): 1

## 2023-10-22 MED ORDER — SENNOSIDES-DOCUSATE SODIUM 8.6-50 MG PO TABS
2.0000 | ORAL_TABLET | Freq: Every day | ORAL | Status: DC
  Administered 2023-10-23 – 2023-10-24 (×2): 2 via ORAL
  Filled 2023-10-22 (×2): qty 2

## 2023-10-22 MED ORDER — MORPHINE SULFATE (PF) 0.5 MG/ML IJ SOLN
INTRAMUSCULAR | Status: DC | PRN
  Administered 2023-10-22: 150 ug via INTRATHECAL

## 2023-10-22 MED ORDER — EPHEDRINE SULFATE-NACL 50-0.9 MG/10ML-% IV SOSY
PREFILLED_SYRINGE | INTRAVENOUS | Status: DC | PRN
  Administered 2023-10-22 (×3): 10 mg via INTRAVENOUS

## 2023-10-22 MED ORDER — FENTANYL CITRATE (PF) 100 MCG/2ML IJ SOLN: 25.0000 ug | INTRAMUSCULAR | Status: DC | PRN

## 2023-10-22 MED ORDER — FENTANYL CITRATE (PF) 100 MCG/2ML IJ SOLN
INTRAMUSCULAR | Status: AC
  Filled 2023-10-22: qty 2

## 2023-10-22 MED ORDER — TRANEXAMIC ACID-NACL 1000-0.7 MG/100ML-% IV SOLN: 1000.0000 mg | Freq: Once | INTRAVENOUS | Status: DC

## 2023-10-22 MED ORDER — CEFAZOLIN SODIUM-DEXTROSE 3-4 GM/150ML-% IV SOLN
3.0000 g | INTRAVENOUS | Status: AC
  Administered 2023-10-22: 3 g via INTRAVENOUS

## 2023-10-22 MED ORDER — ACETAMINOPHEN 500 MG PO TABS
1000.0000 mg | ORAL_TABLET | Freq: Four times a day (QID) | ORAL | Status: DC
  Administered 2023-10-22 – 2023-10-25 (×9): 1000 mg via ORAL
  Filled 2023-10-22 (×10): qty 2

## 2023-10-22 MED ORDER — PHENYLEPHRINE HCL-NACL 20-0.9 MG/250ML-% IV SOLN
INTRAVENOUS | Status: DC | PRN
  Administered 2023-10-22: 60 ug/min via INTRAVENOUS

## 2023-10-22 MED ORDER — ACETAMINOPHEN 500 MG PO TABS: 1000.0000 mg | ORAL_TABLET | Freq: Four times a day (QID) | ORAL | Status: DC

## 2023-10-22 MED ORDER — ACETAMINOPHEN 10 MG/ML IV SOLN
INTRAVENOUS | Status: DC | PRN
  Administered 2023-10-22: 1000 mg via INTRAVENOUS

## 2023-10-22 MED ORDER — SIMETHICONE 80 MG PO CHEW
80.0000 mg | CHEWABLE_TABLET | Freq: Three times a day (TID) | ORAL | Status: DC
  Administered 2023-10-22 – 2023-10-24 (×6): 80 mg via ORAL
  Filled 2023-10-22 (×6): qty 1

## 2023-10-22 MED ORDER — PHENYLEPHRINE HCL-NACL 20-0.9 MG/250ML-% IV SOLN
INTRAVENOUS | Status: AC
Start: 2023-10-22 — End: 2023-10-22
  Filled 2023-10-22: qty 250

## 2023-10-22 MED ORDER — DIPHENHYDRAMINE HCL 25 MG PO CAPS: 25.0000 mg | ORAL_CAPSULE | ORAL | Status: DC | PRN

## 2023-10-22 MED ORDER — OXYCODONE HCL 5 MG PO TABS
5.0000 mg | ORAL_TABLET | ORAL | Status: DC | PRN
  Administered 2023-10-25: 5 mg via ORAL
  Filled 2023-10-22: qty 1

## 2023-10-22 MED ORDER — POVIDONE-IODINE 10 % EX SWAB: 2.0000 | Freq: Once | CUTANEOUS | Status: DC

## 2023-10-22 MED ORDER — EPHEDRINE 5 MG/ML INJ
INTRAVENOUS | Status: AC
  Filled 2023-10-22: qty 5

## 2023-10-22 MED ORDER — KETOROLAC TROMETHAMINE 30 MG/ML IJ SOLN
30.0000 mg | Freq: Four times a day (QID) | INTRAMUSCULAR | Status: DC
  Administered 2023-10-22: 30 mg via INTRAMUSCULAR

## 2023-10-22 MED ORDER — PRENATAL MULTIVITAMIN CH
1.0000 | ORAL_TABLET | Freq: Every day | ORAL | Status: DC
  Administered 2023-10-23 – 2023-10-24 (×2): 1 via ORAL
  Filled 2023-10-22: qty 1

## 2023-10-22 MED ORDER — OXYTOCIN-SODIUM CHLORIDE 30-0.9 UT/500ML-% IV SOLN
1.0000 m[IU]/min | INTRAVENOUS | Status: DC
  Administered 2023-10-22: 2 m[IU]/min via INTRAVENOUS
  Filled 2023-10-22: qty 500

## 2023-10-22 MED ORDER — DIPHENHYDRAMINE HCL 50 MG/ML IJ SOLN: 12.5000 mg | INTRAMUSCULAR | Status: DC | PRN

## 2023-10-22 MED ORDER — ONDANSETRON HCL 4 MG/2ML IJ SOLN: 4.0000 mg | Freq: Three times a day (TID) | INTRAMUSCULAR | Status: DC | PRN

## 2023-10-22 MED ORDER — MENTHOL 3 MG MT LOZG: 1.0000 | LOZENGE | OROMUCOSAL | Status: DC | PRN

## 2023-10-22 MED ORDER — KETOROLAC TROMETHAMINE 30 MG/ML IJ SOLN
30.0000 mg | Freq: Four times a day (QID) | INTRAMUSCULAR | Status: AC
  Administered 2023-10-22 – 2023-10-24 (×8): 30 mg via INTRAVENOUS
  Filled 2023-10-22 (×8): qty 1

## 2023-10-22 MED ORDER — KETOROLAC TROMETHAMINE 30 MG/ML IJ SOLN: 30.0000 mg | Freq: Four times a day (QID) | INTRAMUSCULAR | Status: DC

## 2023-10-22 MED ORDER — OXYCODONE HCL 5 MG/5ML PO SOLN: 5.0000 mg | Freq: Once | ORAL | Status: DC | PRN

## 2023-10-22 MED ORDER — MORPHINE SULFATE (PF) 0.5 MG/ML IJ SOLN
INTRAMUSCULAR | Status: AC
  Filled 2023-10-22: qty 10

## 2023-10-22 MED ORDER — SODIUM CHLORIDE 0.9 % IV SOLN
500.0000 mg | INTRAVENOUS | Status: AC
  Administered 2023-10-22: 500 mg via INTRAVENOUS

## 2023-10-22 MED ORDER — DEXAMETHASONE SOD PHOSPHATE PF 10 MG/ML IJ SOLN
INTRAMUSCULAR | Status: DC | PRN
  Administered 2023-10-22 (×2): 5 mg via INTRAVENOUS

## 2023-10-22 MED ORDER — SODIUM CHLORIDE 0.9% FLUSH: 3.0000 mL | INTRAVENOUS | Status: DC | PRN

## 2023-10-22 MED ORDER — OXYCODONE HCL 5 MG PO TABS: 5.0000 mg | ORAL_TABLET | Freq: Once | ORAL | Status: DC | PRN

## 2023-10-22 MED ORDER — LACTATED RINGERS IV SOLN
INTRAVENOUS | Status: DC
Start: 2023-10-22 — End: 2023-10-25

## 2023-10-22 MED ORDER — NALOXONE HCL 4 MG/10ML IJ SOLN: 1.0000 ug/kg/h | INTRAVENOUS | Status: DC | PRN

## 2023-10-22 MED ORDER — ZOLPIDEM TARTRATE 5 MG PO TABS: 5.0000 mg | ORAL_TABLET | Freq: Every evening | ORAL | Status: DC | PRN

## 2023-10-22 MED ORDER — DIBUCAINE (PERIANAL) 1 % EX OINT: 1.0000 | TOPICAL_OINTMENT | CUTANEOUS | Status: DC | PRN

## 2023-10-22 MED ORDER — FENTANYL CITRATE (PF) 100 MCG/2ML IJ SOLN
INTRAMUSCULAR | Status: DC | PRN
  Administered 2023-10-22: 15 ug via INTRATHECAL

## 2023-10-22 MED ORDER — HYDROMORPHONE HCL 1 MG/ML IJ SOLN: 0.2000 mg | INTRAMUSCULAR | Status: DC | PRN

## 2023-10-22 MED ORDER — BUPIVACAINE IN DEXTROSE 0.75-8.25 % IT SOLN
INTRATHECAL | Status: DC | PRN
Start: 2023-10-22 — End: 2023-10-22
  Administered 2023-10-22: 1.6 mL via INTRATHECAL

## 2023-10-22 MED ORDER — DIPHENHYDRAMINE HCL 25 MG PO CAPS: 25.0000 mg | ORAL_CAPSULE | Freq: Four times a day (QID) | ORAL | Status: DC | PRN

## 2023-10-22 MED ORDER — FAMOTIDINE 20 MG PO TABS
20.0000 mg | ORAL_TABLET | Freq: Two times a day (BID) | ORAL | Status: DC
  Administered 2023-10-22 – 2023-10-24 (×4): 20 mg via ORAL
  Filled 2023-10-22 (×5): qty 1

## 2023-10-22 MED ORDER — OXYTOCIN-SODIUM CHLORIDE 30-0.9 UT/500ML-% IV SOLN
2.5000 [IU]/h | INTRAVENOUS | Status: AC
  Administered 2023-10-22: 2.5 [IU]/h via INTRAVENOUS
  Filled 2023-10-22: qty 500

## 2023-10-22 MED ORDER — NALOXONE HCL 0.4 MG/ML IJ SOLN: 0.4000 mg | INTRAMUSCULAR | Status: DC | PRN

## 2023-10-22 MED ORDER — FLUTICASONE PROPIONATE 50 MCG/ACT NA SUSP
1.0000 | Freq: Every day | NASAL | Status: DC
  Administered 2023-10-23: 1 via NASAL
  Filled 2023-10-22 (×2): qty 16

## 2023-10-22 SURGICAL SUPPLY — 34 items
BENZOIN TINCTURE PRP APPL 2/3 (GAUZE/BANDAGES/DRESSINGS) IMPLANT
CHLORAPREP W/TINT 26 (MISCELLANEOUS) ×2 IMPLANT
CLAMP UMBILICAL CORD (MISCELLANEOUS) ×1 IMPLANT
CLOTH BEACON ORANGE TIMEOUT ST (SAFETY) ×1 IMPLANT
DERMABOND ADVANCED .7 DNX12 (GAUZE/BANDAGES/DRESSINGS) IMPLANT
DRSG OPSITE POSTOP 4X10 (GAUZE/BANDAGES/DRESSINGS) ×1 IMPLANT
ELECTRODE REM PT RTRN 9FT ADLT (ELECTROSURGICAL) ×1 IMPLANT
EXTRACTOR VACUUM M CUP 4 TUBE (SUCTIONS) IMPLANT
GAUZE PAD ABD 7.5X8 STRL (GAUZE/BANDAGES/DRESSINGS) IMPLANT
GAUZE SPONGE 4X4 12PLY STRL LF (GAUZE/BANDAGES/DRESSINGS) IMPLANT
GLOVE BIO SURGEON STRL SZ7.5 (GLOVE) ×1 IMPLANT
GLOVE BIOGEL PI IND STRL 7.0 (GLOVE) ×1 IMPLANT
GOWN SRG XL 47XLVL 4 REINF (GOWN DISPOSABLE) ×1 IMPLANT
GOWN STRL REUS W/TWL LRG LVL3 (GOWN DISPOSABLE) ×1 IMPLANT
KIT ABG SYR 3ML LUER SLIP (SYRINGE) ×1 IMPLANT
MAT PREVALON FULL STRYKER (MISCELLANEOUS) IMPLANT
NDL HYPO 25X5/8 SAFETYGLIDE (NEEDLE) ×1 IMPLANT
NEEDLE HYPO 25X5/8 SAFETYGLIDE (NEEDLE) ×1 IMPLANT
NS IRRIG 1000ML POUR BTL (IV SOLUTION) ×1 IMPLANT
PACK C SECTION WH (CUSTOM PROCEDURE TRAY) ×1 IMPLANT
PAD OB MATERNITY 4.3X12.25 (PERSONAL CARE ITEMS) ×1 IMPLANT
RETRACTOR TRAXI PANNICULUS (MISCELLANEOUS) IMPLANT
STRIP CLOSURE SKIN 1/2X4 (GAUZE/BANDAGES/DRESSINGS) IMPLANT
SUT MNCRL 0 VIOLET CTX 36 (SUTURE) ×4 IMPLANT
SUT PDS AB 0 CTX 60 (SUTURE) ×1 IMPLANT
SUT PLAIN 0 NONE (SUTURE) IMPLANT
SUT PLAIN ABS 2-0 CT1 27XMFL (SUTURE) IMPLANT
SUT VIC AB 4-0 KS 27 (SUTURE) ×1 IMPLANT
SUTURE PLAIN GUT 2.0 ETHICON (SUTURE) IMPLANT
TAPE CLOTH SURG 4X10 WHT LF (GAUZE/BANDAGES/DRESSINGS) IMPLANT
TOWEL OR 17X24 6PK STRL BLUE (TOWEL DISPOSABLE) ×1 IMPLANT
TRAY FOLEY W/BAG SLVR 14FR LF (SET/KITS/TRAYS/PACK) ×1 IMPLANT
VACUUM CUP M-STYLE MYSTIC II (SUCTIONS) IMPLANT
WATER STERILE IRR 1000ML POUR (IV SOLUTION) ×1 IMPLANT

## 2023-10-22 NOTE — H&P (Incomplete)
 [name] is a 43 y.o. female presenting for ***. OB History     Gravida  5   Para  1   Term  1   Preterm      AB  3   Living  1      SAB  2   IAB  1   Ectopic      Multiple  0   Live Births  1          Past Medical History:  Diagnosis Date  . EE (eosinophilic esophagitis) 2019  . Headache   . Hx of pyelonephritis    x1 in high school  . Hx of varicella   . Hypothyroidism   . Infected sebaceous cyst 10/13/2015   Past Surgical History:  Procedure Laterality Date  . ESOPHAGOGASTRODUODENOSCOPY (EGD) WITH PROPOFOL  N/A 08/09/2017   Procedure: ESOPHAGOGASTRODUODENOSCOPY (EGD) WITH PROPOFOL ;  Surgeon: Viktoria Lamar DASEN, MD;  Location: [facility] ENDOSCOPY;  Service: Endoscopy;  Laterality: N/A;  . SHOULDER SURGERY  2014   R shoulder   Family History: family history includes Asthma in her paternal grandmother; Breast cancer in her maternal aunt and paternal aunt; Cancer in her paternal grandmother; Diabetes in her maternal grandmother and paternal grandmother; Heart attack in her brother and mother; Heart disease in her maternal grandmother; Thyroid disease in her maternal aunt, mother, paternal aunt, and paternal uncle. Social History:  reports that she has never smoked. She has never used smokeless tobacco. She reports that she does not currently use alcohol. She reports that she does not use drugs.     Maternal Diabetes: {Maternal Diabetes:3043596} Genetic Screening: {Genetic Screening:20205} Maternal Ultrasounds/Referrals: {Maternal Ultrasounds / Referrals:20211} Fetal Ultrasounds or other Referrals:  {Fetal Ultrasounds or Other Referrals:20213} Maternal Substance Abuse:  {Maternal Substance Abuse:20223} Significant Maternal Medications:  {Significant Maternal Meds:20233} Significant Maternal Lab Results:  {Significant Maternal Lab Results:20235} Number of Prenatal Visits:{Prenatal Visits:27860} Maternal Vaccinations:{Maternal Immunizations:31012} Other  Comments:  {Other Comments:20251}  Review of Systems History Dilation: 2 Effacement (%): 50 Station: -3 Exam by:: Dr Curlene Blood pressure (!) 146/76, pulse 78, temperature 98.4 F (36.9 C), temperature source Oral, resp. rate 16, last menstrual period 02/07/2023, SpO2 99%, unknown if currently breastfeeding. Exam Physical Exam  Prenatal labs: ABO, Rh: --/--/A POS (10/18 2256) Antibody: NEG (10/18 2256) Rubella:   RPR:    HBsAg:    HIV:    GBS:     Assessment/Plan: [name] Curlene II 10/22/2023, 12:02 AM

## 2023-10-22 NOTE — H&P (Signed)
 [name] is a 43 y.o. female presenting for PROM, clear. Few UCs. No HA or vision change. Pregnancy complicated by A2GDM on glyburide 2.5mg  HS, gestational hypertension-no meds and scheduled for IOL 2 days, AMA with NIPS at increased risk of T-18/13 due to low fetal fraction>MFM U/S had normal anatomy, hypothyroidism on levothyroixine 150 mcg daily, anxiety on escitalopram 10mg  daily, sleep apnea, Hx of migraine HA.  OB History     Gravida  5   Para  1   Term  1   Preterm      AB  3   Living  1      SAB  2   IAB  1   Ectopic      Multiple  0   Live Births  1          Past Medical History:  Diagnosis Date   EE (eosinophilic esophagitis) 2019   Headache    Hx of pyelonephritis    x1 in high school   Hx of varicella    Hypothyroidism    Infected sebaceous cyst 10/13/2015   Past Surgical History:  Procedure Laterality Date   ESOPHAGOGASTRODUODENOSCOPY (EGD) WITH PROPOFOL  N/A 08/09/2017   Procedure: ESOPHAGOGASTRODUODENOSCOPY (EGD) WITH PROPOFOL ;  Surgeon: Viktoria Lamar DASEN, MD;  Location: [facility] ENDOSCOPY;  Service: Endoscopy;  Laterality: N/A;   SHOULDER SURGERY  2014   R shoulder   Family History: family history includes Asthma in her paternal grandmother; Breast cancer in her maternal aunt and paternal aunt; Cancer in her paternal grandmother; Diabetes in her maternal grandmother and paternal grandmother; Heart attack in her brother and mother; Heart disease in her maternal grandmother; Thyroid disease in her maternal aunt, mother, paternal aunt, and paternal uncle. Social History:  reports that she has never smoked. She has never used smokeless tobacco. She reports that she does not currently use alcohol. She reports that she does not use drugs.     Maternal Diabetes: Yes:  Diabetes Type:  Insulin/Medication controlled Genetic Screening: Abnormal:  Results: Elevated risk of Trisomy 18 Maternal Ultrasounds/Referrals: Normal Fetal Ultrasounds or other  Referrals:  Referred to Materal Fetal Medicine  Maternal Substance Abuse:  No Significant Maternal Medications:  Meds include: Zantac Syntroid Other: escitalopram, glyburide Significant Maternal Lab Results:  None Number of Prenatal Visits:greater than 3 verified prenatal visits Maternal Vaccinations: Other Comments:  None  Review of Systems  Constitutional:  Negative for fever.  Eyes:  Negative for visual disturbance.  Neurological:  Negative for headaches.   Maternal Medical History:  Reason for admission: Rupture of membranes.   Fetal activity: Perceived fetal activity is normal.     Dilation: 2 Effacement (%): 50 Station: -3 Exam by:: Dr Curlene Blood pressure (!) 146/76, pulse 78, temperature 98.4 F (36.9 C), temperature source Oral, resp. rate 16, last menstrual period 02/07/2023, SpO2 99%, unknown if currently breastfeeding. Maternal Exam:  Abdomen: Fetal presentation: vertex   Fetal Exam Fetal State Assessment: Category I - tracings are normal.   Physical Exam Cardiovascular:     Rate and Rhythm: Normal rate.  Pulmonary:     Effort: Pulmonary effort is normal.     Cx 2/50/-3/vtx/soft UCs irregular, mild  Prenatal labs: ABO, Rh: --/--/A POS (10/18 2256) Antibody: NEG (10/18 2256) Rubella:   RPR:    HBsAg:    HIV:    GBS:   unknown  Assessment/Plan: 43 yo G5P1 @ 36 5/7 wks PROM A2GDM Gestational hypertension-normal labs Unknown GBBS-PCR ordered, PCN ordered per GBBS protocol  D/W patient pitocin  in about 2 hours after PCN infused All questions answered. She states she understands and agrees    Lynwood FORBES Clubs II 10/22/2023, 12:02 AM

## 2023-10-22 NOTE — Brief Op Note (Signed)
 10/21/2023 - 10/22/2023  9:17 AM  PATIENT:  [name]  43 y.o. female  PRE-OPERATIVE DIAGNOSIS:  Primary cesarean section for transverse lie  POST-OPERATIVE DIAGNOSIS:  Primary cesarean section for transverse lie  PROCEDURE:  Procedure(s): CESAREAN DELIVERY (N/A)  SURGEON:  Surgeons and Role:    DEWAINE Curlene Agent, MD - Primary  PHYSICIAN ASSISTANT:   ASSISTANTS:    ANESTHESIA:   spinal  EBL:  333 ml   BLOOD ADMINISTERED:none  DRAINS: Urinary Catheter (Foley)   LOCAL MEDICATIONS USED:    SPECIMEN:  Source of Specimen:  placenta  DISPOSITION OF SPECIMEN:  PATHOLOGY  COUNTS:  YES  TOURNIQUET:  * No tourniquets in log *  DICTATION: .Other Dictation: Dictation Number [id number]  PLAN OF CARE: Admit to inpatient   PATIENT DISPOSITION:  PACU - hemodynamically stable.   Delay start of Pharmacological VTE agent (>24hrs) due to surgical blood loss or risk of bleeding: not applicable

## 2023-10-22 NOTE — Anesthesia Postprocedure Evaluation (Signed)
 Anesthesia Post Note  Patient: [name]  Procedure(s) Performed: CESAREAN DELIVERY     Patient location during evaluation: L&D Anesthesia Type: Spinal Level of consciousness: oriented and awake and alert Pain management: pain level controlled Vital Signs Assessment: post-procedure vital signs reviewed and stable Respiratory status: spontaneous breathing, respiratory function stable and patient connected to nasal cannula oxygen Cardiovascular status: blood pressure returned to baseline and stable Postop Assessment: no headache, no backache, no apparent nausea or vomiting and spinal receding Anesthetic complications: no   No notable events documented.  Last Vitals:  Vitals:   10/22/23 1031 10/22/23 1037  BP: 102/75   Pulse:    Resp: 14 15  Temp:  (!) 36.4 C  SpO2:      Last Pain:  Vitals:   10/22/23 1022  TempSrc:   PainSc: 0-No pain   Pain Goal:                Epidural/Spinal Function Cutaneous sensation: Tingles (10/22/23 1022), Patient able to flex knees: Yes (10/22/23 1022), Patient able to lift hips off bed: No (10/22/23 1022), Back pain beyond tenderness at insertion site: No (10/22/23 1022), Progressively worsening motor and/or sensory loss: No (10/22/23 1022), Bowel and/or bladder incontinence post epidural: No (10/22/23 1022)  Rome Ade

## 2023-10-22 NOTE — Lactation Note (Signed)
 This note was copied from a baby's chart. Lactation Consultation Note  Patient Name: [name] Date: 10/22/2023 Age:42 hours Reason for consult: Initial assessment;Late-preterm 34-36.6wks;Maternal endocrine disorder  P2. Mom has an 74 yr old son that she BF for 6 months. Baby has had low glucose and is very sleepy. Has been gelled 2 times and given formula but would only take 5 ml. Baby is very sleepy. Rn placed baby STS on mom's chest and covered w/warm blankets. Reviewed LPI information on feeding time and supplementing amounts. Asked RN Alisha to set up DEBP for [facility] w/#21 flange. Discussed w/mom importance of STS and pumping this LPI. Mom is concerned she will not have milk and has no problem supplementing and pumping. Encouraged to pump every 3 hrs. May sleep and miss one tonight. Mom stated she didn't see a big difference in her breast changes during pregnancy. Congratulated parents on baby arrival. Encouraged to call for feeding assistance or questions as needed.  Maternal Data Has patient been taught Hand Expression?: Yes Does the patient have breastfeeding experience prior to this delivery?: Yes How long did the patient breastfeed?: 6 months  Feeding Mother's Current Feeding Choice: Breast Milk and Formula Nipple Type: Extra Slow Flow  LATCH Score       Type of Nipple: Everted at rest and after stimulation  Comfort (Breast/Nipple): Soft / non-tender         Lactation Tools Discussed/Used    Interventions Interventions: Breast feeding basics reviewed;Skin to skin;Hand express;Education;LC Services brochure;LPT handout/interventions;Infant Driven Feeding Algorithm education  Discharge Pump: Referral sent for [facility] Pump  Consult Status Consult Status: Follow-up Date: 10/23/23 Follow-up type: In-patient    [name] G 10/22/2023, 9:19 PM

## 2023-10-22 NOTE — Progress Notes (Signed)
 Pt requesting medication for heartburn. Called Dr. Tomblin and verbal order received.

## 2023-10-22 NOTE — Op Note (Signed)
 [name], [name] MEDICAL RECORD NO: [id number] ACCOUNT NO: [id number] DATE OF BIRTH: December 27, 1980 FACILITY: [facility] LOCATION: [id number] PHYSICIAN: Lynwood BRAVO. Jashawna Reever II, MD  Operative Report   DATE OF PROCEDURE: 10/22/2023   PREOPERATIVE DIAGNOSES: 1. Intrauterine pregnancy at 69 and 5/7 weeks. 2. Gestational diabetes. 3. Gestational hypertension. 4. Malpresentation.  POSTOPERATIVE DIAGNOSES: 1. Intrauterine pregnancy at 65 and 5/7 weeks. 2. Gestational diabetes. 3. Gestational hypertension. 4. Malpresentation.  PROCEDURE: Low transverse incision.  SURGEON: Lynwood Clubs, MD  ANESTHESIA: Spinal.  ESTIMATED BLOOD LOSS: 333 mL.  SPECIMENS: Placenta pathology.  FINDINGS: Viable female infant. Apgars and birth weight pending.  INDICATIONS AND CONSENT: This patient is a 43 year old G5 P1 at 30 and 5/7 weeks. She has gestational diabetes controlled on glyburide and gestational hypertension. She presents with spontaneous rupture of membranes for clear fluid. Examination at  admission reveals the cervix to be 2 cm dilated, 50% effaced, -3 station and vertex. Group B strep culture is unknown. She is started on penicillin per the group B strep protocol. She is placed on Pitocin . After several hours, cervical examination  reveals no presenting part palpable. Bedside ultrasound reveals an oblique to transverse lie with the back up. Cesarean section is recommended. Potential risks and complications were discussed preoperatively including but not limited to infection, organ  damage, bleeding requiring transfusion of blood products with HIV and hepatitis acquisition, DVT, PE, pneumonia, and wound breakdown. She states she understands and agrees and consent is signed on the chart.  DESCRIPTION OF PROCEDURE: The patient is taken to the operating room where she is identified. Spinal anesthetic was placed by anesthesiology and she was placed in the dorsal supine position with a left lateral wedge.  She was then prepped vaginally with  Betadine. Foley catheter was placed and she was prepped abdominally with ChloraPrep. Timeout was done. Ancef, azithromycin, and TXA are ordered. She is then draped in a sterile fashion. After testing for adequate spinal anesthesia, skin was entered  through a Pfannenstiel incision and dissection was carried out in layers to the peritoneum. Peritoneum extended superiorly and inferiorly. Vesicouterine peritoneum was taken down and the bladder flap developed. There was very little development of the  lower uterine segment. Low transverse incision was made and the uterine cavity was entered bluntly with a hemostat. The incisions extended with the fingers bilaterally. Vertex was then delivered with the aid of a vacuum extractor to elevate the vertex  through the incision. The baby was then delivered without difficulty. Good crying tone is noted. Cord was clamped and cut after 1 minute. The baby is handed to waiting pediatrics team. Placenta is delivered and sent to pathology. Careful inspection  reveals the cavity to be clean. Uterus is closed in a running, locking, imbricating fashion of 2 layers and good hemostasis was noted. This was well clear of the bladder. Anterior peritoneum was then closed in a running fashion with a 0 Monocryl, which  was also used to reapproximate the pyramidalis muscle in the midline. Anterior rectus fascia was closed in a running fashion with 2-0 looped PDS. Careful inspection revealed good closure of the fascia. Subcutaneous layer was then closed with interrupted  plain and the skin was closed in a subcuticular fashion with 4-0 Vicryl and a Keith needle. Benzoin, Steri-Strips, honeycomb, and pressure dressing were applied. All counts were correct. The patient was taken to the recovery room in stable condition.    Amey.Baba D: 10/22/2023 9:23:33 am T: 10/22/2023 9:45:00 am  JOB: [id number]/ [id number]

## 2023-10-22 NOTE — Progress Notes (Signed)
 Dr Tomblin at bedside to assess cervical dilation but could not feel presenting part of fetus. Bedside U/S indicates infant is lying in transverse position. Oxytocin  stopped and discussion about C/S at bedside with pt and s/o.

## 2023-10-22 NOTE — Anesthesia Preprocedure Evaluation (Signed)
 Anesthesia Evaluation  Patient identified by MRN, date of birth, ID band Patient awake  General Assessment Comment:  C/s for transverse lie  Reviewed: Allergy & Precautions, NPO status , Patient's Chart, lab work & pertinent test results  History of Anesthesia Complications Negative for: history of anesthetic complications  Airway Mallampati: III  TM Distance: >3 FB Neck ROM: Full    Dental no notable dental hx. (+) Teeth Intact   Pulmonary neg pulmonary ROS, neg sleep apnea, neg COPD, Patient abstained from smoking.Not current smoker   Pulmonary exam normal breath sounds clear to auscultation       Cardiovascular Exercise Tolerance: Good METS(-) hypertension(-) CAD and (-) Past MI negative cardio ROS (-) dysrhythmias  Rhythm:Regular Rate:Normal - Systolic murmurs    Neuro/Psych  Headaches  negative psych ROS   GI/Hepatic ,neg GERD  ,,(+)     (-) substance abuse    Endo/Other  diabetes, GestationalHypothyroidism  Class 3 obesity  Renal/GU negative Renal ROS     Musculoskeletal   Abdominal  (+) + obese  Peds  Hematology Denies blood thinner use or bleeding disorders.    Anesthesia Other Findings Denies blood thinner use or bleeding diatheses. Recent labs reviewed. Past Medical History: 2019: EE (eosinophilic esophagitis) No date: Headache No date: Hx of pyelonephritis     Comment:  x1 in high school No date: Hx of varicella No date: Hypothyroidism 10/13/2015: Infected sebaceous cyst   Reproductive/Obstetrics (+) Pregnancy                              Anesthesia Physical Anesthesia Plan  ASA: 3  Anesthesia Plan: Spinal   Post-op Pain Management: Ofirmev  IV (intra-op)* and Toradol IV (intra-op)*   Induction:   PONV Risk Score and Plan: 3 and Ondansetron  and Dexamethasone  Airway Management Planned: Natural Airway  Additional Equipment:   Intra-op Plan:    Post-operative Plan:   Informed Consent: I have reviewed the patients History and Physical, chart, labs and discussed the procedure including the risks, benefits and alternatives for the proposed anesthesia with the patient or authorized representative who has indicated his/her understanding and acceptance.       Plan Discussed with: CRNA and Surgeon  Anesthesia Plan Comments: (Discussed R/B/A of neuraxial anesthesia technique with patient: - rare risks of spinal/epidural hematoma, nerve damage, infection - Risk of PDPH - Risk of itching - Risk of nausea and vomiting - Risk of conversion to general anesthesia and its associated risks, including sore throat, damage to lips/teeth/oropharynx, and rare risks such as cardiac and respiratory events. - Risk of surgical bleeding requiring blood products - Risk of allergic reactions Patient informed about increased incidence of above perioperative risk due to high BMI. Patient understands.   Discussed the role of CRNA in patient's perioperative care.  Patient voiced understanding.)        Anesthesia Quick Evaluation

## 2023-10-22 NOTE — Progress Notes (Signed)
 FHT cat one UCs difficult to trace about q2-4 min Cx unable to feel presenting part BSUS>transverse back up Pitocin  stopped Rec: cesarean section. Procedure discussed as well as risks including infection, organ damage, bleeding/transfusion-HIV/Hep, DVT/PE, pneumonia, wound breakdown. She state she understands and agrees.

## 2023-10-22 NOTE — Transfer of Care (Signed)
 Immediate Anesthesia Transfer of Care Note  Patient: [name]  Procedure(s) Performed: CESAREAN DELIVERY  Patient Location: PACU  Anesthesia Type:Spinal  Level of Consciousness: awake  Airway & Oxygen Therapy: Patient Spontanous Breathing  Post-op Assessment: Report given to RN  Post vital signs: Reviewed and stable  Last Vitals:  Vitals Value Taken Time  BP 153/71 10/22/23 09:37  Temp    Pulse    Resp 24 10/22/23 09:37  SpO2    Vitals shown include unfiled device data.  Last Pain:  Vitals:   10/22/23 0937  TempSrc:   PainSc: 0-No pain         Complications: No notable events documented.

## 2023-10-22 NOTE — Anesthesia Procedure Notes (Signed)
 Spinal  Patient location during procedure: OR Start time: 10/22/2023 7:55 AM End time: 10/22/2023 7:58 AM Reason for block: surgical anesthesia Staffing Performed: anesthesiologist  Anesthesiologist: Boone Fess, MD Performed by: Boone Fess, MD Authorized by: Boone Fess, MD   Preanesthetic Checklist Completed: patient identified, IV checked, site marked, risks and benefits discussed, surgical consent, monitors and equipment checked, pre-op evaluation and timeout performed Spinal Block Patient position: sitting Prep: ChloraPrep and site prepped and draped Patient monitoring: heart rate, continuous pulse ox, blood pressure and cardiac monitor Approach: midline Location: L3-4 Injection technique: single-shot Needle Needle type: Whitacre and Introducer  Needle gauge: 24 G Needle length: 9 cm Assessment Sensory level: T10 Events: CSF return Additional Notes Meticulous sterile technique used throughout (CHG prep, sterile gloves, sterile drape). Negative paresthesia. Negative blood return. Positive free-flowing CSF. Expiration date of kit checked and confirmed. Patient tolerated procedure well, without complications.

## 2023-10-23 LAB — CBC
HCT: 37.5 % (ref 36.0–46.0)
Hemoglobin: 12.4 g/dL (ref 12.0–15.0)
MCH: 28.8 pg (ref 26.0–34.0)
MCHC: 33.1 g/dL (ref 30.0–36.0)
MCV: 87.2 fL (ref 80.0–100.0)
Platelets: 247 K/uL (ref 150–400)
RBC: 4.3 MIL/uL (ref 3.87–5.11)
RDW: 13.8 % (ref 11.5–15.5)
WBC: 11.3 K/uL — ABNORMAL HIGH (ref 4.0–10.5)
nRBC: 0 % (ref 0.0–0.2)

## 2023-10-23 LAB — GLUCOSE, CAPILLARY: Glucose-Capillary: 82 mg/dL (ref 70–99)

## 2023-10-23 NOTE — Progress Notes (Signed)
 POD # 1  Doing well No concerns BP 106/64 (BP Location: Left Arm)   Pulse 64   Temp 98.1 F (36.7 C) (Oral)   Resp 16   Ht 5' 3 (1.6 m)   Wt 120.2 kg   LMP 02/07/2023   SpO2 96%   Breastfeeding Unknown   BMI 46.94 kg/m  Results for orders placed or performed during the hospital encounter of 10/21/23 (from the past 24 hours)  Glucose, capillary     Status: None   Collection Time: 10/23/23  5:11 AM  Result Value Ref Range   Glucose-Capillary 82 70 - 99 mg/dL  CBC     Status: Abnormal   Collection Time: 10/23/23  5:31 AM  Result Value Ref Range   WBC 11.3 (H) 4.0 - 10.5 K/uL   RBC 4.30 3.87 - 5.11 MIL/uL   Hemoglobin 12.4 12.0 - 15.0 g/dL   HCT 62.4 63.9 - 53.9 %   MCV 87.2 80.0 - 100.0 fL   MCH 28.8 26.0 - 34.0 pg   MCHC 33.1 30.0 - 36.0 g/dL   RDW 86.1 88.4 - 84.4 %   Platelets 247 150 - 400 K/uL   nRBC 0.0 0.0 - 0.2 %   Abdomen is soft and non tender  Pressure dressing removed - completely dry  Honeycomb is dry   POD # 1  Doing well Routine care  Circ deferred by peds due to blood sugar issues

## 2023-10-24 ENCOUNTER — Inpatient Hospital Stay (HOSPITAL_COMMUNITY)

## 2023-10-24 ENCOUNTER — Inpatient Hospital Stay (HOSPITAL_COMMUNITY): Admission: RE | Admit: 2023-10-24 | Source: Home / Self Care | Admitting: Obstetrics and Gynecology

## 2023-10-24 LAB — COMPREHENSIVE METABOLIC PANEL WITH GFR
ALT: 15 U/L (ref 0–44)
AST: 16 U/L (ref 15–41)
Albumin: 2.5 g/dL — ABNORMAL LOW (ref 3.5–5.0)
Alkaline Phosphatase: 85 U/L (ref 38–126)
Anion gap: 9 (ref 5–15)
BUN: 10 mg/dL (ref 6–20)
CO2: 24 mmol/L (ref 22–32)
Calcium: 9.1 mg/dL (ref 8.9–10.3)
Chloride: 104 mmol/L (ref 98–111)
Creatinine, Ser: 0.71 mg/dL (ref 0.44–1.00)
GFR, Estimated: >60 mL/min (ref >60)
Glucose, Bld: 94 mg/dL (ref 70–99)
Potassium: 4.3 mmol/L (ref 3.5–5.1)
Sodium: 137 mmol/L (ref 135–145)
Total Bilirubin: 0.5 mg/dL (ref 0.0–1.2)
Total Protein: 5.8 g/dL — ABNORMAL LOW (ref 6.5–8.1)

## 2023-10-24 LAB — CBC
HCT: 36 % (ref 36.0–46.0)
Hemoglobin: 12.2 g/dL (ref 12.0–15.0)
MCH: 29.4 pg (ref 26.0–34.0)
MCHC: 33.9 g/dL (ref 30.0–36.0)
MCV: 86.7 fL (ref 80.0–100.0)
Platelets: 245 K/uL (ref 150–400)
RBC: 4.15 MIL/uL (ref 3.87–5.11)
RDW: 13.7 % (ref 11.5–15.5)
WBC: 9.9 K/uL (ref 4.0–10.5)
nRBC: 0 % (ref 0.0–0.2)

## 2023-10-24 LAB — SURGICAL PATHOLOGY

## 2023-10-24 NOTE — Plan of Care (Signed)
 Problem: Health Behavior/Discharge Planning: Goal: Ability to manage health-related needs will improve Outcome: Progressing   Problem: Clinical Measurements: Goal: Ability to maintain clinical measurements within normal limits will improve Outcome: Progressing Goal: Will remain free from infection Outcome: Progressing Goal: Diagnostic test results will improve Outcome: Progressing   Problem: Activity: Goal: Risk for activity intolerance will decrease Outcome: Progressing   Problem: Coping: Goal: Level of anxiety will decrease Outcome: Progressing   Problem: Elimination: Goal: Will not experience complications related to bowel motility Outcome: Progressing Goal: Will not experience complications related to urinary retention Outcome: Progressing   Problem: Pain Managment: Goal: General experience of comfort will improve and/or be controlled Outcome: Progressing   Problem: Safety: Goal: Ability to remain free from injury will improve Outcome: Progressing   Problem: Skin Integrity: Goal: Risk for impaired skin integrity will decrease Outcome: Progressing   Problem: Education: Goal: Ability to describe self-care measures that may prevent or decrease complications (Diabetes Survival Skills Education) will improve Outcome: Progressing Goal: Individualized Educational Video(s) Outcome: Progressing   Problem: Coping: Goal: Ability to adjust to condition or change in health will improve Outcome: Progressing   Problem: Fluid Volume: Goal: Ability to maintain a balanced intake and output will improve Outcome: Progressing   Problem: Health Behavior/Discharge Planning: Goal: Ability to identify and utilize available resources and services will improve Outcome: Progressing Goal: Ability to manage health-related needs will improve Outcome: Progressing   Problem: Metabolic: Goal: Ability to maintain appropriate glucose levels will improve Outcome: Progressing   Problem:  Nutritional: Goal: Maintenance of adequate nutrition will improve Outcome: Progressing Goal: Progress toward achieving an optimal weight will improve Outcome: Progressing   Problem: Skin Integrity: Goal: Risk for impaired skin integrity will decrease Outcome: Progressing   Problem: Tissue Perfusion: Goal: Adequacy of tissue perfusion will improve Outcome: Progressing   Problem: Education: Goal: Knowledge of Childbirth will improve Outcome: Progressing Goal: Ability to make informed decisions regarding treatment and plan of care will improve Outcome: Progressing Goal: Ability to state and carry out methods to decrease the pain will improve Outcome: Progressing Goal: Individualized Educational Video(s) Outcome: Progressing   Problem: Coping: Goal: Ability to verbalize concerns and feelings about labor and delivery will improve Outcome: Progressing   Problem: Life Cycle: Goal: Ability to make normal progression through stages of labor will improve Outcome: Progressing Goal: Ability to effectively push during vaginal delivery will improve Outcome: Progressing   Problem: Role Relationship: Goal: Will demonstrate positive interactions with the child Outcome: Progressing   Problem: Safety: Goal: Risk of complications during labor and delivery will decrease Outcome: Progressing   Problem: Pain Management: Goal: Relief or control of pain from uterine contractions will improve Outcome: Progressing   Problem: Education: Goal: Knowledge of the prescribed therapeutic regimen will improve Outcome: Progressing Goal: Understanding of sexual limitations or changes related to disease process or condition will improve Outcome: Progressing Goal: Individualized Educational Video(s) Outcome: Progressing   Problem: Self-Concept: Goal: Communication of feelings regarding changes in body function or appearance will improve Outcome: Progressing   Problem: Skin Integrity: Goal:  Demonstration of wound healing without infection will improve Outcome: Progressing   Problem: Education: Goal: Knowledge of condition will improve Outcome: Progressing Goal: Individualized Educational Video(s) Outcome: Progressing Goal: Individualized Newborn Educational Video(s) Outcome: Progressing   Problem: Activity: Goal: Will verbalize the importance of balancing activity with adequate rest periods Outcome: Progressing Goal: Ability to tolerate increased activity will improve Outcome: Progressing   Problem: Coping: Goal: Ability to identify and utilize available resources and  services will improve Outcome: Progressing   Problem: Life Cycle: Goal: Chance of risk for complications during the postpartum period will decrease Outcome: Progressing   Problem: Role Relationship: Goal: Ability to demonstrate positive interaction with newborn will improve Outcome: Progressing

## 2023-10-24 NOTE — Progress Notes (Signed)
 Subjective: Postpartum Day 2: Cesarean Delivery Patient reports incisional pain, tolerating PO, and no problems voiding.    Objective: Vital signs in last 24 hours: Temp:  [97.8 F (36.6 C)-98.3 F (36.8 C)] 97.8 F (36.6 C) (10/21 0617) Pulse Rate:  [62-82] 73 (10/21 0617) Resp:  [18-20] 18 (10/21 0617) BP: (91-140)/(68-79) 140/79 (10/21 0617) SpO2:  [98 %-100 %] 98 % (10/21 0617)  Physical Exam:  General: alert, cooperative, appears stated age, and no distress Lochia: appropriate Uterine Fundus: firm Incision: healing well DVT Evaluation: No evidence of DVT seen on physical exam.  Recent Labs    10/21/23 2254 10/23/23 0531  HGB 14.1 12.4  HCT 41.0 37.5    Assessment/Plan: Status post Cesarean section. Doing well postoperatively.  Continue current care Circ done today.SABRA Alm JAYSON Marget, MD 10/24/2023, 11:33 AM

## 2023-10-24 NOTE — Lactation Note (Signed)
 This note was copied from a baby's chart. 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 Change in weight 01.03% to -3.94%.  P2, Per MOB, infant has been sleepy since his circumcision earlier today. Time for a feeding, MOB attempted to latch infant on her left breast using the football hold position, infant latch bur did not elicit the suck swallow response only held MOB's nipple in his mouth. Per MOB, infant has been latching well and prior to his circumcision he latched twice today for 15 minutes. MOB has pumped twice today 1st session she expressed 35 mls and 2nd session 20 mls which she offered to infant. Per MOB, infant has been consuming 30 -35 mls of 20 kcal formula per feeding, MOB was formula feeding infant as LC left the room, he had already consumed 30 mls of 22 kca formula. MOB plans to pump again after she finishes feeding infant. MOB knows to call [facility] services if she has any BF questions, concerns or if she needs latch asssitance.   LPTI 's  current feeding Plan- Day 3 of life. 1-MOB plans to latch infant first every feeding but limit chest ( breastfeeding) 15 minutes or less, immediately afterwards she plans to offer infant her pumped EBM first and then 22 kcal formula each feeding. 2- MOB knows on Day 3 to supplement infant with 30 mls per feeding or more if he wants it. 3- MOB plans to start pumping every 3 hours for 15 mintues on initial setting and give infant back her EBM first before formula.   Maternal Data    Feeding Mother's Current Feeding Choice: Breast Milk and Formula Nipple Type: Nfant Standard Flow (white)  LATCH Score Latch: Too sleepy or reluctant, no latch achieved, no sucking elicited.  Audible Swallowing: None  Type of Nipple: Everted at rest and  after stimulation  Comfort (Breast/Nipple): Soft / non-tender  Hold (Positioning): Assistance needed to correctly position infant at breast and maintain latch.  LATCH Score: 5   Lactation Tools Discussed/Used    Interventions Interventions: Skin to skin;Assisted with latch;Breast compression;Adjust position;Support pillows;Position options;Expressed milk;DEBP;Education;Pace feeding;LPT handout/interventions;CDC milk storage guidelines;CDC Guidelines for Breast Pump Cleaning;Guidelines for Milk Supply and Pumping Schedule Handout  Discharge Pump: DEBP;Received Stork Pump  Consult Status Consult Status: Follow-up Date: 10/25/23 Follow-up type: In-patient    [name] 10/24/2023, 5:39 PM

## 2023-10-25 MED ORDER — IBUPROFEN 600 MG PO TABS: 600.0000 mg | ORAL_TABLET | Freq: Four times a day (QID) | ORAL | 1 refills | Status: AC | PRN

## 2023-10-25 MED ORDER — OXYCODONE HCL 5 MG PO TABS: 5.0000 mg | ORAL_TABLET | ORAL | 0 refills | Status: AC | PRN

## 2023-10-25 NOTE — Progress Notes (Addendum)
 MOB was referred for history of anxiety.  * Referral screened out by Clinical Social Worker because none of the following criteria appear to apply:  ~ History of anxiety during this pregnancy, or of post-partum depression following prior delivery.  ~ Diagnosis of anxiety within last 3 years  OR  * MOB's symptoms currently being treated with medication and/or therapy.  Per OB notes, MOB has an active prescription for Escitalopram 10mg   Edinburgh score 2  Please contact the Clinical Social Worker if needs arise, by [facility] request, or if MOB scores greater than 9/yes to question 10 on Edinburgh Postpartum Depression Screen.  [name], [place] Clinical Social Worker [phone]

## 2023-10-25 NOTE — Progress Notes (Signed)
 Subjective: Postpartum Day 3: Cesarean Delivery Patient reports tolerating PO, + flatus, and no problems voiding.  No HA, vision change, RUQ pain, CP/SOB.  Objective: Vital signs in last 24 hours: Temp:  [98 F (36.7 C)-98.4 F (36.9 C)] 98.4 F (36.9 C) (10/22 0606) Pulse Rate:  [71-77] 71 (10/22 0606) Resp:  [18-20] 20 (10/22 0606) BP: (127-151)/(67-92) 133/72 (10/22 0606) SpO2:  [98 %-99 %] 98 % (10/22 0606)  Physical Exam:  General: alert, cooperative, and appears stated age 43: appropriate Uterine Fundus: firm Incision: healing well, no significant drainage, no dehiscence DVT Evaluation: No evidence of DVT seen on physical exam. Negative Homan's sign. No cords or calf tenderness.  Recent Labs    10/23/23 0531 10/24/23 1532  HGB 12.4 12.2  HCT 37.5 36.0    Assessment/Plan: Status post Cesarean section. Doing well postoperatively.  GHTN-recently normal BPs on no medication.  Asymptomatic.  Will plan office visit for BP check Monday. Discharge home with standard precautions and return to clinic in 4-6 weeks.  Duwaine Blumenthal, DO 10/25/2023, 9:51 AM

## 2023-10-25 NOTE — Discharge Instructions (Signed)
 Call MD for T>100.4, heavy vaginal bleeding, severe abdominal pain, intractable nausea and/or vomiting, or respiratory distress. Call office to schedule postpartum visit in 6 weeks.  Office will call patient to schedule BP check on Monday.  Pelvic rest x 6 weeks.  No driving while taking narcotics.  No heavy lifting.

## 2023-10-25 NOTE — Discharge Summary (Signed)
 Postpartum Discharge Summary    Patient Name: [name] DOB: 03-Nov-1980 MRN: [id number]  Date of admission: 10/21/2023 Delivery date:10/22/2023 Delivering provider: TOMBLIN, JAMES Date of discharge: 10/25/2023  Admitting diagnosis: PROM (premature rupture of membranes) [O42.90] Intrauterine pregnancy: [redacted]w[redacted]d     Secondary diagnosis:  Principal Problem:   PROM (premature rupture of membranes)  Additional problems: PPROM, GHTN, A2DM, malpresentation    Discharge diagnosis: Preterm Pregnancy Delivered, Gestational Hypertension, and GDM A2                                              Post partum procedures:none Augmentation: N/A Complications: None  Hospital course: Onset of Labor With Unplanned C/S   43 y.o. yo [id number] at [redacted]w[redacted]d was admitted in Latent Labor on 10/21/2023. Patient had a labor course significant for PPROM and transverse presentation. The patient went for cesarean section due to [facility] and PPROM. Delivery details as follows: Membrane Rupture Time/Date: 9:00 PM,10/21/2023  Delivery Method:C-Section, Low Transverse Operative Delivery:N/A Details of operation can be found in separate operative note. Patient had a postpartum course complicated by n/a.  She is ambulating,tolerating a regular diet, passing flatus, and urinating well.  Patient is discharged home in stable condition 10/25/23.  Newborn Data: Birth date:10/22/2023 Birth time:8:26 AM Gender:Female Living status:Living Apgars:8 ,9  Weight:2920 g  Magnesium Sulfate received: No BMZ received: No Rhophylac:No MMR:No T-DaP:Given prenatally Flu: No RSV Vaccine received: No Transfusion:No  Immunizations received: Immunization History  Administered Date(s) Administered   Tdap 12/15/2014    Physical exam  Vitals:   10/24/23 1404 10/24/23 1647 10/24/23 2105 10/25/23 0606  BP: (!) 144/80 (!) 149/75 127/67 133/72  Pulse:   73 71  Resp:   20 20  Temp:   98.1 F (36.7 C) 98.4 F (36.9 C)   TempSrc:   Oral Oral  SpO2:   99% 98%  Weight:      Height:       General: alert, cooperative, and no distress Lochia: appropriate Uterine Fundus: firm Incision: Healing well with no significant drainage, No significant erythema, Dressing is clean, dry, and intact DVT Evaluation: No evidence of DVT seen on physical exam. Negative Homan's sign. No cords or calf tenderness. Labs: Lab Results  Component Value Date   WBC 9.9 10/24/2023   HGB 12.2 10/24/2023   HCT 36.0 10/24/2023   MCV 86.7 10/24/2023   PLT 245 10/24/2023      Latest Ref Rng & Units 10/24/2023    3:32 PM  CMP  Glucose 70 - 99 mg/dL 94   BUN 6 - 20 mg/dL 10   Creatinine 9.55 - 1.00 mg/dL 9.28   Sodium 864 - 854 mmol/L 137   Potassium 3.5 - 5.1 mmol/L 4.3   Chloride 98 - 111 mmol/L 104   CO2 22 - 32 mmol/L 24   Calcium 8.9 - 10.3 mg/dL 9.1   Total Protein 6.5 - 8.1 g/dL 5.8   Total Bilirubin 0.0 - 1.2 mg/dL 0.5   Alkaline Phos 38 - 126 U/L 85   AST 15 - 41 U/L 16   ALT 0 - 44 U/L 15    Edinburgh Score:     No data to display         No data recorded  After visit meds:  Allergies as of 10/25/2023   No Known Allergies  Medication List     STOP taking these medications    aspirin EC 81 MG tablet   glyBURIDE 2.5 MG tablet Commonly known as: DIABETA   multivitamin with minerals tablet   omeprazole 40 MG capsule Commonly known as: PRILOSEC       TAKE these medications    escitalopram 10 MG tablet Commonly known as: LEXAPRO Take 10 mg by mouth daily.   fluticasone  50 MCG/ACT nasal spray Commonly known as: FLONASE  1 or 2 sprays each nostril twice a day   ibuprofen  600 MG tablet Commonly known as: ADVIL  Take 1 tablet (600 mg total) by mouth every 6 (six) hours as needed.   levothyroxine  137 MCG tablet Commonly known as: SYNTHROID  Take 1 tablet (137 mcg total) by mouth daily.   multivitamin-prenatal 27-0.8 MG Tabs tablet Take 1 tablet by mouth daily at 12 noon.    oxyCODONE  5 MG immediate release tablet Commonly known as: Oxy IR/ROXICODONE  Take 1 tablet (5 mg total) by mouth every 4 (four) hours as needed for up to 7 days for moderate pain (pain score 4-6).         Discharge home in stable condition Infant Feeding: Breast Infant Disposition:home with mother Discharge instruction: per After Visit Summary and Postpartum booklet. Activity: Advance as tolerated. Pelvic rest for 6 weeks.  Diet: routine diet Future Appointments:No future appointments. Follow up Visit: Monday for BP check, 6 weeks PPV  10/25/2023 Duwaine Blumenthal, DO

## 2023-11-07 ENCOUNTER — Telehealth (HOSPITAL_COMMUNITY): Payer: Self-pay | Admitting: *Deleted

## 2023-11-07 NOTE — Telephone Encounter (Signed)
 11/07/2023  Name: [name] MRN: [id number] DOB: 1980/06/20  Reason for Call:  Transition of Care Hospital Discharge Call  Contact Status: Patient Contact Status: Complete  Language assistant needed: Interpreter Mode: Interpreter Not Needed        Follow-Up Questions: Do You Have Any Concerns About Your Health As You Heal From Delivery?: No Do You Have Any Concerns About Your Infants Health?: No  Edinburgh Postnatal Depression Scale:  In the Past 7 Days: I have been able to laugh and see the funny side of things.: As much as I always could I have looked forward with enjoyment to things.: As much as I ever did I have blamed myself unnecessarily when things went wrong.: Not very often I have been anxious or worried for no good reason.: No, not at all I have felt scared or panicky for no good reason.: No, not at all Things have been getting on top of me.: No, I have been coping as well as ever I have been so unhappy that I have had difficulty sleeping.: Not at all I have felt sad or miserable.: No, not at all I have been so unhappy that I have been crying.: No, never The thought of harming myself has occurred to me.: Never Edinburgh Postnatal Depression Scale Total: 1  PHQ2-9 Depression Scale:     Discharge Follow-up: Edinburgh score requires follow up?: No Patient was advised of the following resources:: Support Group, Breastfeeding Support Group  Post-discharge interventions: Reviewed Newborn Safe Sleep Practices  Mliss Sieve, RN 11/07/2023 11:13

## 2023-12-06 ENCOUNTER — Ambulatory Visit: Admission: RE | Admit: 2023-12-06 | Discharge: 2023-12-06 | Disposition: A | Source: Ambulatory Visit

## 2023-12-06 ENCOUNTER — Other Ambulatory Visit: Payer: Self-pay

## 2023-12-06 DIAGNOSIS — R2242 Localized swelling, mass and lump, left lower limb: Secondary | ICD-10-CM | POA: Insufficient documentation

## 2023-12-06 DIAGNOSIS — Z98891 History of uterine scar from previous surgery: Secondary | ICD-10-CM | POA: Diagnosis present
